# Patient Record
Sex: Female | Born: 1990 | Race: Black or African American | Hispanic: No | Marital: Single | Smoking: Never smoker
Health system: Southern US, Community
[De-identification: ages and names within clinical notes are randomized; demographics above are authoritative.]

## PROBLEM LIST (undated history)

## (undated) DIAGNOSIS — Z789 Other specified health status: Secondary | ICD-10-CM

---

## 2016-09-07 LAB — OB RESULTS CONSOLE HEPATITIS B SURFACE ANTIGEN: Hepatitis B Surface Ag: NEGATIVE

## 2016-09-07 LAB — OB RESULTS CONSOLE ABO/RH: RH TYPE: NEGATIVE

## 2016-09-07 LAB — OB RESULTS CONSOLE RUBELLA ANTIBODY, IGM: Rubella: IMMUNE

## 2016-09-07 LAB — OB RESULTS CONSOLE GC/CHLAMYDIA
Chlamydia: NEGATIVE
Gonorrhea: NEGATIVE

## 2016-09-07 LAB — OB RESULTS CONSOLE HIV ANTIBODY (ROUTINE TESTING): HIV: NONREACTIVE

## 2016-09-07 LAB — OB RESULTS CONSOLE RPR: RPR: NONREACTIVE

## 2017-02-11 ENCOUNTER — Inpatient Hospital Stay (HOSPITAL_COMMUNITY)
Admission: AD | Admit: 2017-02-11 | Discharge: 2017-02-14 | DRG: 782 | Discharge status: Against Medical Advice | Payer: Medicaid Other | Source: Ambulatory Visit | Attending: Obstetrics and Gynecology | Admitting: Obstetrics and Gynecology

## 2017-02-11 ENCOUNTER — Encounter (HOSPITAL_COMMUNITY): Payer: Self-pay

## 2017-02-11 ENCOUNTER — Inpatient Hospital Stay (HOSPITAL_COMMUNITY): Payer: Medicaid Other

## 2017-02-11 DIAGNOSIS — O403XX0 Polyhydramnios, third trimester, not applicable or unspecified: Secondary | ICD-10-CM | POA: Diagnosis present

## 2017-02-11 DIAGNOSIS — Z5321 Procedure and treatment not carried out due to patient leaving prior to being seen by health care provider: Secondary | ICD-10-CM | POA: Diagnosis present

## 2017-02-11 DIAGNOSIS — O4693 Antepartum hemorrhage, unspecified, third trimester: Secondary | ICD-10-CM

## 2017-02-11 DIAGNOSIS — O34211 Maternal care for low transverse scar from previous cesarean delivery: Secondary | ICD-10-CM | POA: Diagnosis present

## 2017-02-11 DIAGNOSIS — O4593 Premature separation of placenta, unspecified, third trimester: Secondary | ICD-10-CM | POA: Diagnosis not present

## 2017-02-11 DIAGNOSIS — Z880 Allergy status to penicillin: Secondary | ICD-10-CM

## 2017-02-11 DIAGNOSIS — Z3A35 35 weeks gestation of pregnancy: Secondary | ICD-10-CM | POA: Diagnosis not present

## 2017-02-11 DIAGNOSIS — O4690 Antepartum hemorrhage, unspecified, unspecified trimester: Secondary | ICD-10-CM

## 2017-02-11 DIAGNOSIS — N939 Abnormal uterine and vaginal bleeding, unspecified: Secondary | ICD-10-CM | POA: Diagnosis present

## 2017-02-11 HISTORY — DX: Other specified health status: Z78.9

## 2017-02-11 LAB — RAPID URINE DRUG SCREEN, HOSP PERFORMED
AMPHETAMINES: NOT DETECTED
Barbiturates: NOT DETECTED
Benzodiazepines: NOT DETECTED
COCAINE: NOT DETECTED
Opiates: NOT DETECTED
TETRAHYDROCANNABINOL: POSITIVE — AB

## 2017-02-11 LAB — CBC
HCT: 26.1 % — ABNORMAL LOW (ref 36.0–46.0)
HCT: 26.5 % — ABNORMAL LOW (ref 36.0–46.0)
HEMATOCRIT: 27.9 % — AB (ref 36.0–46.0)
HEMOGLOBIN: 8.8 g/dL — AB (ref 12.0–15.0)
HEMOGLOBIN: 8.8 g/dL — AB (ref 12.0–15.0)
Hemoglobin: 9.3 g/dL — ABNORMAL LOW (ref 12.0–15.0)
MCH: 27.1 pg (ref 26.0–34.0)
MCH: 26.7 pg (ref 26.0–34.0)
MCH: 27 pg (ref 26.0–34.0)
MCHC: 33.7 g/dL (ref 30.0–36.0)
MCHC: 33.2 g/dL (ref 30.0–36.0)
MCHC: 33.3 g/dL (ref 30.0–36.0)
MCV: 80.3 fL (ref 78.0–100.0)
MCV: 80.5 fL (ref 78.0–100.0)
MCV: 80.9 fL (ref 78.0–100.0)
PLATELETS: 248 10*3/uL (ref 150–400)
PLATELETS: 262 10*3/uL (ref 150–400)
Platelets: 240 10*3/uL (ref 150–400)
RBC: 3.25 MIL/uL — ABNORMAL LOW (ref 3.87–5.11)
RBC: 3.29 MIL/uL — AB (ref 3.87–5.11)
RBC: 3.45 MIL/uL — AB (ref 3.87–5.11)
RDW: 14.8 % (ref 11.5–15.5)
RDW: 14.8 % (ref 11.5–15.5)
RDW: 14.9 % (ref 11.5–15.5)
WBC: 7.9 10*3/uL (ref 4.0–10.5)
WBC: 10 10*3/uL (ref 4.0–10.5)
WBC: 12.3 10*3/uL — AB (ref 4.0–10.5)

## 2017-02-11 LAB — RPR: RPR: NONREACTIVE

## 2017-02-11 LAB — HIV ANTIBODY (ROUTINE TESTING W REFLEX): HIV SCREEN 4TH GENERATION: NONREACTIVE

## 2017-02-11 LAB — OB RESULTS CONSOLE GBS: STREP GROUP B AG: NEGATIVE

## 2017-02-11 LAB — GROUP B STREP BY PCR: GROUP B STREP BY PCR: NEGATIVE

## 2017-02-11 MED ORDER — ACETAMINOPHEN 325 MG PO TABS: 650.0000 mg | ORAL_TABLET | ORAL | Status: DC | PRN

## 2017-02-11 MED ORDER — EPHEDRINE 5 MG/ML INJ: 10.0000 mg | INTRAVENOUS | Status: DC | PRN

## 2017-02-11 MED ORDER — PHENYLEPHRINE 40 MCG/ML (10ML) SYRINGE FOR IV PUSH (FOR BLOOD PRESSURE SUPPORT): 80.0000 ug | PREFILLED_SYRINGE | INTRAVENOUS | Status: DC | PRN

## 2017-02-11 MED ORDER — PRENATAL MULTIVITAMIN CH
1.0000 | ORAL_TABLET | Freq: Every day | ORAL | Status: DC
  Filled 2017-02-11: qty 1

## 2017-02-11 MED ORDER — FLEET ENEMA 7-19 GM/118ML RE ENEM: 1.0000 | ENEMA | RECTAL | Status: DC | PRN

## 2017-02-11 MED ORDER — LACTATED RINGERS IV SOLN
INTRAVENOUS | Status: DC
  Administered 2017-02-11: 13:00:00 via INTRAVENOUS

## 2017-02-11 MED ORDER — FENTANYL 2.5 MCG/ML BUPIVACAINE 1/10 % EPIDURAL INFUSION (WH - ANES): 14.0000 mL/h | INTRAMUSCULAR | Status: DC | PRN

## 2017-02-11 MED ORDER — OXYTOCIN BOLUS FROM INFUSION: 500.0000 mL | Freq: Once | INTRAVENOUS | Status: DC

## 2017-02-11 MED ORDER — LIDOCAINE HCL (PF) 1 % IJ SOLN: 30.0000 mL | INTRAMUSCULAR | Status: DC | PRN

## 2017-02-11 MED ORDER — OXYTOCIN 40 UNITS IN LACTATED RINGERS INFUSION - SIMPLE MED: 2.5000 [IU]/h | INTRAVENOUS | Status: DC

## 2017-02-11 MED ORDER — FENTANYL CITRATE (PF) 100 MCG/2ML IJ SOLN
100.0000 ug | INTRAMUSCULAR | Status: DC | PRN
  Administered 2017-02-11 (×3): 100 ug via INTRAVENOUS
  Filled 2017-02-11 (×3): qty 2

## 2017-02-11 MED ORDER — BUTORPHANOL TARTRATE 1 MG/ML IJ SOLN
1.0000 mg | INTRAMUSCULAR | Status: DC | PRN
  Administered 2017-02-11: 1 mg via INTRAVENOUS
  Filled 2017-02-11: qty 1

## 2017-02-11 MED ORDER — BETAMETHASONE SOD PHOS & ACET 6 (3-3) MG/ML IJ SUSP
12.0000 mg | INTRAMUSCULAR | Status: AC
  Administered 2017-02-11 – 2017-02-12 (×2): 12 mg via INTRAMUSCULAR
  Filled 2017-02-11 (×3): qty 2

## 2017-02-11 MED ORDER — SOD CITRATE-CITRIC ACID 500-334 MG/5ML PO SOLN
30.0000 mL | ORAL | Status: DC | PRN
  Filled 2017-02-11: qty 15

## 2017-02-11 MED ORDER — ACETAMINOPHEN 325 MG PO TABS
650.0000 mg | ORAL_TABLET | ORAL | Status: DC | PRN
  Filled 2017-02-11: qty 2

## 2017-02-11 MED ORDER — ZOLPIDEM TARTRATE 5 MG PO TABS
5.0000 mg | ORAL_TABLET | Freq: Every evening | ORAL | Status: DC | PRN
  Administered 2017-02-11: 5 mg via ORAL
  Filled 2017-02-11: qty 1

## 2017-02-11 MED ORDER — OXYCODONE-ACETAMINOPHEN 5-325 MG PO TABS: 1.0000 | ORAL_TABLET | ORAL | Status: DC | PRN

## 2017-02-11 MED ORDER — LACTATED RINGERS IV SOLN: INTRAVENOUS | Status: DC

## 2017-02-11 MED ORDER — CALCIUM CARBONATE ANTACID 500 MG PO CHEW: 2.0000 | CHEWABLE_TABLET | ORAL | Status: DC | PRN

## 2017-02-11 MED ORDER — LACTATED RINGERS IV SOLN: 500.0000 mL | INTRAVENOUS | Status: DC | PRN

## 2017-02-11 MED ORDER — DOCUSATE SODIUM 100 MG PO CAPS
100.0000 mg | ORAL_CAPSULE | Freq: Every day | ORAL | Status: DC
  Filled 2017-02-11: qty 1

## 2017-02-11 MED ORDER — OXYCODONE-ACETAMINOPHEN 5-325 MG PO TABS
2.0000 | ORAL_TABLET | ORAL | Status: DC | PRN
Start: 2017-02-11 — End: 2017-02-12

## 2017-02-11 MED ORDER — LACTATED RINGERS IV SOLN
INTRAVENOUS | Status: DC
  Administered 2017-02-11: 06:00:00 via INTRAVENOUS

## 2017-02-11 MED ORDER — DIPHENHYDRAMINE HCL 50 MG/ML IJ SOLN: 12.5000 mg | INTRAMUSCULAR | Status: DC | PRN

## 2017-02-11 MED ORDER — LACTATED RINGERS IV SOLN: 500.0000 mL | Freq: Once | INTRAVENOUS | Status: DC

## 2017-02-11 MED ORDER — ONDANSETRON HCL 4 MG/2ML IJ SOLN
4.0000 mg | Freq: Four times a day (QID) | INTRAMUSCULAR | Status: DC | PRN
  Administered 2017-02-11: 4 mg via INTRAVENOUS
  Filled 2017-02-11: qty 2

## 2017-02-11 NOTE — Progress Notes (Signed)
Patient ID: Brandy Wang, female   DOB: [birth year], 26 y.o.   MRN: [id number] Pt admitted with vaginal bleeding. Has continued to bleed since admission, changing pad q1hr. Continues to contract q 2-4 minutes and is uncomfortable RNST SVE 1/thick/-3 H/O C/S at 28 weeks [facility] Will transfer to L & D for continued monitoring and eval for labor.

## 2017-02-11 NOTE — Anesthesia Pain Management Evaluation Note (Signed)
  CRNA Pain Management Visit Note  Patient: Brandy Wang, 26 y.o., female  "Hello I am a member of the anesthesia team at [facility]. We have an anesthesia team available at all times to provide care throughout the hospital, including epidural management and anesthesia for C-section. I don't know your plan for the delivery whether it a natural birth, water birth, IV sedation, nitrous supplementation, doula or epidural, but we want to meet your pain goals."   1.Was your pain managed to your expectations on prior hospitalizations?   Yes   2.What is your expectation for pain management during this hospitalization?     Epidural  3.How can we help you reach that goal? Support prn  Record the patient's initial score and the patient's pain goal.   Pain: 5  Pain Goal: 3 [facility] wants you to be able to say your pain was always managed very well.  [facility],Brandy Wang 02/11/2017

## 2017-02-11 NOTE — MAU Note (Signed)
Pt here with c/o vaginal bleeding and cramping. Reports decreased fetal movement since the bleeding. "High risk because I delivered my others babies early, 36 wks and 28 weeks."

## 2017-02-11 NOTE — Progress Notes (Signed)
Patient ID: Brandy SettersJaneika Wang, female   DOB: 09/26/1990, 26 y.o.   MRN: [id number] Pt still feeling ut ctx. Pain medication does help some. VB has almost stopped FHT's 140-150's + acel's Cat 1 Toco ut ctx q 2 - 4 minutes SVE unchanged, some old blood noted on glove H & H stable  A/P IUP 35 1/7 weeks        Probable placental abruption        Prior C section  Will continue to observe in L & D. No evidence of labor at present. H & H stable. Will proceed to c section based on maternal and fetal indications

## 2017-02-11 NOTE — Progress Notes (Signed)
Patient ID: Brandy SettersJaneika Wang, female   DOB: April 19, 1991, 26 y.o.   MRN: [id number]  S: Patient seen & examined for progress of labor. Patient rating her pain 8/10, but is sleeping when walking in the room or was talking on the phone earlier. States the fentanyl is only working for 15-20 min and then wears off.    O:  Vitals:   02/11/17 1155 02/11/17 1303 02/11/17 1432 02/11/17 1515  BP: 100/82 115/66    Pulse: 72 73    Resp:  18 18 18   Temp:  98.2 F (36.8 C)    TempSrc:  Axillary    SpO2:      Weight:      Height:        Dilation: 1 Exam by:: Dr Alysia PennaErvin   FHT: 125bpm, mod var, +accels, no decels TOCO: q842min  No further large amounts of active bleeding  A/P: Switching from fentanyl to stadol Continue expectant management

## 2017-02-11 NOTE — MAU Note (Signed)
Pt reports that she woke up with cramping. Pt had increased pain when going up to the bathroom. Pt felt a warm gush and then noticed it was bright red blood. + FM.

## 2017-02-11 NOTE — MAU Provider Note (Signed)
History     CSN: [id number]  Arrival date and time: 02/11/17 0441   First Provider Initiated Contact with Patient 02/11/17 0534      Chief Complaint  Patient presents with  . Abdominal Pain  . Vaginal Bleeding   [id number] @35 .1 wks here with VB and pain. She woke up around  0300 with LAP and went to BR and noticed bright red blood in toilet. Some bleeding since. Pain is intermittent in lower abdomen. No recent IC. Reports +FM. She is receiving care at [facility] and reports hx of previous CS at 28 wks for preeclampsia and this pregnancy complicated by polyhydramnios.    OB History    Gravida Para Term Preterm AB Living   4 2     1 2    SAB TAB Ectopic Multiple Live Births   1       2      Past Medical History:  Diagnosis Date  . Medical history non-contributory     Past Surgical History:  Procedure Laterality Date  . CESAREAN SECTION      No family history on file.  Social History  Substance Use Topics  . Smoking status: Never Smoker  . Smokeless tobacco: Never Used  . Alcohol use No    Allergies:  Allergies  Allergen Reactions  . Penicillins Hives    No prescriptions prior to admission.    Review of Systems  Gastrointestinal: Positive for abdominal pain.  Genitourinary: Positive for vaginal bleeding.   Physical Exam   Blood pressure 123/77, pulse 83, temperature 98.3 F (36.8 C), temperature source Oral, resp. rate 20, height 5' 7.5" (1.715 m), weight 73.9 kg (163 lb), SpO2 100 %.  Physical Exam  Nursing note and vitals reviewed. Constitutional: She is oriented to person, place, and time. She appears well-developed and well-nourished. No distress (appears uncomfortable).  HENT:  Head: Normocephalic and atraumatic.  Neck: Normal range of motion.  Cardiovascular: Normal rate.   Respiratory: Effort normal.  GI: Soft. She exhibits no distension. There is no tenderness.  gravid  Genitourinary:  Genitourinary Comments: External: no lesions or  erythema Vagina: rugated, large amt watery red pooling, fern neg Cervix closed/50  Musculoskeletal: Normal range of motion.  Neurological: She is alert and oriented to person, place, and time.  Skin: Skin is warm and dry.  Psychiatric: She has a normal mood and affect.   EFM: 145 bpm, mod variability, + accels, no decels Toco: 2-6  Results for orders placed or performed during the hospital encounter of 02/11/17 (from the past 24 hour(s))  Group B strep by PCR     Status: None   Collection Time: 02/11/17  5:50 AM  Result Value Ref Range   Group B strep by PCR NEGATIVE NEGATIVE  CBC     Status: Abnormal   Collection Time: 02/11/17  6:00 AM  Result Value Ref Range   WBC 7.9 4.0 - 10.5 K/uL   RBC 3.25 (L) 3.87 - 5.11 MIL/uL   Hemoglobin 8.8 (L) 12.0 - 15.0 g/dL   HCT 02.7 (L) 25.3 - 66.4 %   MCV 80.3 78.0 - 100.0 fL   MCH 27.1 26.0 - 34.0 pg   MCHC 33.7 30.0 - 36.0 g/dL   RDW 40.3 47.4 - 25.9 %   Platelets 248 150 - 400 K/uL  Type and screen     Status: None   Collection Time: 02/11/17  6:00 AM  Result Value Ref Range   ABO/RH(D) O NEG  Antibody Screen POS    Sample Expiration 02/14/2017   Urine rapid drug screen (hosp performed)     Status: Abnormal   Collection Time: 02/11/17  6:35 AM  Result Value Ref Range   Opiates NONE DETECTED NONE DETECTED   Cocaine NONE DETECTED NONE DETECTED   Benzodiazepines NONE DETECTED NONE DETECTED   Amphetamines NONE DETECTED NONE DETECTED   Tetrahydrocannabinol POSITIVE (A) NONE DETECTED   Barbiturates NONE DETECTED NONE DETECTED   US: vtx, AFI 16cm, no previa or abruption MAU Course  Procedures  MDM Labs and US ordered. Presentation, clinical findings, and plan discussed with Dr. Shawnie PonsPratt. Plan for admit. Review of records shows hx of polyhydramnios, previous CS (LTCS w/2 layer closure), hx pre-e in previous pregnancies, ?IUGR.   Assessment and Plan  35 weeks Suspected abruption Reactive NST  Admit to Ante Management per  MD  Brandy Wang, CNM 02/11/2017, 6:01 AM

## 2017-02-12 DIAGNOSIS — N939 Abnormal uterine and vaginal bleeding, unspecified: Secondary | ICD-10-CM | POA: Diagnosis present

## 2017-02-12 LAB — CBC
HEMATOCRIT: 23.7 % — AB (ref 36.0–46.0)
HEMOGLOBIN: 8 g/dL — AB (ref 12.0–15.0)
MCH: 27 pg (ref 26.0–34.0)
MCHC: 33.8 g/dL (ref 30.0–36.0)
MCV: 80.1 fL (ref 78.0–100.0)
Platelets: 234 10*3/uL (ref 150–400)
RBC: 2.96 MIL/uL — ABNORMAL LOW (ref 3.87–5.11)
RDW: 15 % (ref 11.5–15.5)
WBC: 13.6 10*3/uL — ABNORMAL HIGH (ref 4.0–10.5)

## 2017-02-12 MED ORDER — PRENATAL MULTIVITAMIN CH
1.0000 | ORAL_TABLET | Freq: Every day | ORAL | Status: DC
  Filled 2017-02-12: qty 1

## 2017-02-12 MED ORDER — CALCIUM CARBONATE ANTACID 500 MG PO CHEW: 2.0000 | CHEWABLE_TABLET | ORAL | Status: DC | PRN

## 2017-02-12 MED ORDER — DOCUSATE SODIUM 100 MG PO CAPS
100.0000 mg | ORAL_CAPSULE | Freq: Every day | ORAL | Status: DC
  Administered 2017-02-12 – 2017-02-13 (×2): 100 mg via ORAL
  Filled 2017-02-12 (×3): qty 1

## 2017-02-12 MED ORDER — COMPLETENATE 29-1 MG PO CHEW
1.0000 | CHEWABLE_TABLET | Freq: Every day | ORAL | Status: DC
  Administered 2017-02-13: 1 via ORAL
  Filled 2017-02-12 (×3): qty 1

## 2017-02-12 MED ORDER — ACETAMINOPHEN 325 MG PO TABS: 650.0000 mg | ORAL_TABLET | ORAL | Status: DC | PRN

## 2017-02-12 MED ORDER — ZOLPIDEM TARTRATE 5 MG PO TABS
5.0000 mg | ORAL_TABLET | Freq: Every evening | ORAL | Status: DC | PRN
  Administered 2017-02-12 – 2017-02-13 (×2): 5 mg via ORAL
  Filled 2017-02-12 (×2): qty 1

## 2017-02-12 MED ORDER — LACTATED RINGERS IV SOLN: INTRAVENOUS | Status: DC

## 2017-02-12 NOTE — Progress Notes (Signed)
Patient ID: Brandy Wang, female   DOB: [birth year], 26 y.o.   MRN: [id number] Pt rested well overnight. Reports only a few ut ctx now. Little bleeding as well. Pt fetal movement. Wants something to eat.  PE AF VSS RNST  Toco ut ctx q10-15 mins Lungs clear Heart RRR Abd soft + Bs gravid non tender SVE no change min blood noted on glove  A/P     IUP 35 2/7 weeks     Vaginal Bleeding     Prior c section desires TOLAC.  No evidence of active labor. S/P BMZ x 2. Will transfer to antepartum for continued monitoring.

## 2017-02-12 NOTE — Progress Notes (Signed)
Labor Progress Note  Brandy SettersJaneika Wang is a 26 y.o. [phone] at 2480w2d  admitted for vaginal bleeding and suspected abruption.  S: Patient not very comfortable on my exam, complaining of leg numbness and difficulty getting comfortable, wants to walk around.  O:  BP 127/83   Pulse 79   Temp 98.4 F (36.9 C) (Axillary)   Resp 18   Ht 5' 7.5" (1.715 m)   Wt 73.9 kg (163 lb)   LMP  (Exact Date)   SpO2 100%   BMI 25.15 kg/m   No intake/output data recorded.  FHT:  130 bmp, mod variability, accels, no decels UC:   regular, every 4 minutes SVE:   Dilation: 1 Effacement (%): Thick Station: -3 Exam by:: Dr Omer JackMumaw  Labs: Lab Results  Component Value Date   WBC 12.3 (H) 02/11/2017   HGB 9.3 (L) 02/11/2017   HCT 27.9 (L) 02/11/2017   MCV 80.9 02/11/2017   PLT 262 02/11/2017    Assessment / Plan: 26 y.o. [id number] 2680w2d  Presenting for abruption, admitted for observation. Patient wanting to walk around, asked that she keep activity to minimum, walk around room and then try to rest in bed.  No further large amounts of active bleeding.  Fetal Wellbeing:  Category I Pain Control:  Currently well controlled  Continue expectant management  Howard PouchLauren Marithza Malachi, MD PGY-1 [facility]

## 2017-02-12 NOTE — Progress Notes (Signed)
Labor Progress Note  Brandy Wang is a 26 y.o. [id number] at 6468w2d  admitted for vaginal bleeding, suspected abruption.  FHT:  120 bmp, mod variability, accels, no decels UC:   occasional SVE:   Dilation: 1 Effacement (%): Thick Station: -3 Exam by:: Dr Brandy Wang   Assessment / Plan: 26 y.o. [id number] 6468w2d presenting for abruption.  No further large amounts of active bleeding.  Fetal Wellbeing:  Category I Pain Control:  Currently well controlled  Continue expectant management Expectant management  Brandy PouchLauren [name], MD PGY-1 [facility]

## 2017-02-13 MED ORDER — POLYETHYLENE GLYCOL 3350 17 G PO PACK
17.0000 g | PACK | Freq: Every day | ORAL | Status: DC
  Administered 2017-02-13: 17 g via ORAL
  Filled 2017-02-13: qty 1

## 2017-02-13 NOTE — Progress Notes (Signed)
FACULTY PRACTICE ANTEPARTUM(COMPREHENSIVE) NOTE  Brandy Wang is a 26 y.o. [id number] at 7936w3d who is admitted for vagina bleeding.   Fetal presentation is cephalic. Length of Stay:  2  Days  Subjective: Patient reports the fetal movement as active. Patient reports uterine contraction  activity as none. Patient reports  vaginal bleeding as none. Patient describes fluid per vagina as None.  Vitals:  Blood pressure 116/62, pulse 87, temperature 98 F (36.7 C), temperature source Oral, resp. rate 18, height 5' 7.5" (1.715 m), weight 73.9 kg (163 lb), SpO2 99 %. Physical Examination:  General appearance - alert, well appearing, and in no distress Heart - normal rate and regular rhythm Abdomen - soft, nontender, nondistended Fundal Height:  size equals dates Cervical Exam: Not evaluated.  Extremities: extremities normal, atraumatic, no cyanosis or edema and Homans sign is negative, no sign of DVT  Membranes:intact  Fetal Monitoring:     Fetal Heart Rate A  Mode External filed at 02/12/2017 2228  Baseline Rate (A) 140 bpm filed at 02/12/2017 2228  Variability 6-25 BPM filed at 02/12/2017 2228  Accelerations 15 x 15 filed at 02/12/2017 2228  Decelerations None filed at 02/12/2017 2228     Labs:  Results for orders placed or performed during the hospital encounter of 02/11/17 (from the past 24 hour(s))  CBC   Collection Time: 02/12/17  8:17 AM  Result Value Ref Range   WBC 13.6 (H) 4.0 - 10.5 K/uL   RBC 2.96 (L) 3.87 - 5.11 MIL/uL   Hemoglobin 8.0 (L) 12.0 - 15.0 g/dL   HCT 45.423.7 (L) 09.836.0 - 11.946.0 %   MCV 80.1 78.0 - 100.0 fL   MCH 27.0 26.0 - 34.0 pg   MCHC 33.8 30.0 - 36.0 g/dL   RDW 14.715.0 82.911.5 - 56.215.5 %   Platelets 234 150 - 400 K/uL      Medications:  Scheduled . docusate sodium  100 mg Oral Daily  . prenatal vitamin w/FE, FA  1 tablet Oral Q1200   I have reviewed the patient's current medications.  ASSESSMENT: Patient Active Problem List   Diagnosis Date Noted  .  Vaginal bleeding 02/12/2017  . Vaginal bleeding in pregnancy 02/11/2017  . Vaginal bleeding in pregnancy, third trimester 02/11/2017    PLAN: Observe for recurrent bleeding, PTL, ROM  Brandy Wang 02/13/2017,7:52 AM

## 2017-02-14 DIAGNOSIS — O4693 Antepartum hemorrhage, unspecified, third trimester: Secondary | ICD-10-CM

## 2017-02-14 DIAGNOSIS — Z3A35 35 weeks gestation of pregnancy: Secondary | ICD-10-CM | POA: Diagnosis not present

## 2017-02-14 LAB — TYPE AND SCREEN
ABO/RH(D): O NEG
Antibody Screen: POSITIVE
DAT, IgG: NEGATIVE
UNIT DIVISION: 0
Unit division: 0

## 2017-02-14 LAB — GC/CHLAMYDIA PROBE AMP (~~LOC~~) NOT AT ARMC
Chlamydia: NEGATIVE
NEISSERIA GONORRHEA: NEGATIVE

## 2017-02-14 LAB — BPAM RBC
BLOOD PRODUCT EXPIRATION DATE: 201806282359
BLOOD PRODUCT EXPIRATION DATE: 201806282359
Unit Type and Rh: 9500
Unit Type and Rh: 9500

## 2017-02-14 NOTE — Progress Notes (Addendum)
MD made aware that patient is requesting to leave. Patient then stating that she will not wait for him to see her, instructing the RN to remove her SL. Per MD patient leaving AMA. RN reviewing with patient PTL s/s and pre-E s/s as well as fetal kick counts. ROM or additional VB would need to be evaluated.  Patient stating that she understands. AMA signed by patient and no additional questions by patient or significant other.

## 2017-02-14 NOTE — Progress Notes (Signed)
OB Note Came by to round on pt and not in room. Will come by later.  Cornelia Copaharlie Toree Edling, Jr MD Attending Center for [facility] (Faculty Practice) 02/14/2017 Time: 1000

## 2017-02-14 NOTE — H&P (Signed)
Chief Complaint  Patient presents with  . Abdominal Pain  . Vaginal Bleeding   [id number] @35 .1 wks here with VB and pain. She woke up around  0300 with LAP and went to BR and noticed bright red blood in toilet. Some bleeding since. Pain is intermittent in lower abdomen. No recent IC. Reports +FM. She is receiving care at [facility] and reports hx of previous CS at 28 wks for preeclampsia and this pregnancy complicated by polyhydramnios.           OB History    Gravida Para Term Preterm AB Living   4 2     1 2    SAB TAB Ectopic Multiple Live Births   1       2          Past Medical History:  Diagnosis Date  . Medical history non-contributory          Past Surgical History:  Procedure Laterality Date  . CESAREAN SECTION      No family history on file.      Social History  Substance Use Topics  . Smoking status: Never Smoker  . Smokeless tobacco: Never Used  . Alcohol use No    Allergies:      Allergies  Allergen Reactions  . Penicillins Hives    No prescriptions prior to admission.    Review of Systems  Gastrointestinal: Positive for abdominal pain.  Genitourinary: Positive for vaginal bleeding.   Physical Exam   Blood pressure 123/77, pulse 83, temperature 98.3 F (36.8 C), temperature source Oral, resp. rate 20, height 5' 7.5" (1.715 m), weight 73.9 kg (163 lb), SpO2 100 %.  Physical Exam  Nursing note and vitals reviewed. Constitutional: She is oriented to person, place, and time. She appears well-developed and well-nourished. No distress (appears uncomfortable).  HENT:  Head: Normocephalic and atraumatic.  Neck: Normal range of motion.  Cardiovascular: Normal rate.   Respiratory: Effort normal.  GI: Soft. She exhibits no distension. There is no tenderness.  gravid  Genitourinary:  Genitourinary Comments: External: no lesions or erythema Vagina: rugated, large amt watery red pooling, fern neg Cervix closed/50   Musculoskeletal: Normal range of motion.  Neurological: She is alert and oriented to person, place, and time.  Skin: Skin is warm and dry.  Psychiatric: She has a normal mood and affect.   EFM: 145 bpm, mod variability, + accels, no decels Toco: 2-6  LabResultsLast24Hours  Results for orders placed or performed during the hospital encounter of 02/11/17 (from the past 24 hour(s))  Group B strep by PCR     Status: None   Collection Time: 02/11/17  5:50 AM  Result Value Ref Range   Group B strep by PCR NEGATIVE NEGATIVE  CBC     Status: Abnormal   Collection Time: 02/11/17  6:00 AM  Result Value Ref Range   WBC 7.9 4.0 - 10.5 K/uL   RBC 3.25 (L) 3.87 - 5.11 MIL/uL   Hemoglobin 8.8 (L) 12.0 - 15.0 g/dL   HCT 45.4 (L) 09.8 - 11.9 %   MCV 80.3 78.0 - 100.0 fL   MCH 27.1 26.0 - 34.0 pg   MCHC 33.7 30.0 - 36.0 g/dL   RDW 14.7 82.9 - 56.2 %   Platelets 248 150 - 400 K/uL  Type and screen     Status: None   Collection Time: 02/11/17  6:00 AM  Result Value Ref Range   ABO/RH(D) O NEG    Antibody Screen  POS    Sample Expiration 02/14/2017   Urine rapid drug screen (hosp performed)     Status: Abnormal   Collection Time: 02/11/17  6:35 AM  Result Value Ref Range   Opiates NONE DETECTED NONE DETECTED   Cocaine NONE DETECTED NONE DETECTED   Benzodiazepines NONE DETECTED NONE DETECTED   Amphetamines NONE DETECTED NONE DETECTED   Tetrahydrocannabinol POSITIVE (A) NONE DETECTED   Barbiturates NONE DETECTED NONE DETECTED     US: vtx, AFI 16cm, no previa or abruption MAU Course  Procedures  MDM Labs and US ordered. Presentation, clinical findings, and plan discussed with Dr. Shawnie PonsPratt. Plan for admit. Review of records shows hx of polyhydramnios, previous CS (LTCS w/2 layer closure), hx pre-e in previous pregnancies, ?IUGR.   Assessment and Plan  35 weeks Suspected abruption Reactive NST  Admit to Ante Management per MD  Donette LarryMelanie Bhambri,  CNM 02/11/2017, 6:01 AM

## 2017-02-17 LAB — TYPE AND SCREEN
ABO/RH(D): O NEG
ANTIBODY SCREEN: POSITIVE
DAT, IGG: NEGATIVE
Unit division: 0
Unit division: 0

## 2017-02-17 LAB — BPAM RBC
BLOOD PRODUCT EXPIRATION DATE: 201806282359
BLOOD PRODUCT EXPIRATION DATE: 201806282359
UNIT TYPE AND RH: 9500
UNIT TYPE AND RH: 9500

## 2017-02-18 NOTE — Discharge Summary (Addendum)
Obstetrics Discharge Summary Date of Admission: 02/11/2017 Date of Discharge: 02/14/2017  Patient left AMA, per RN staff  Allergies as of 02/14/2017      Reactions   Other Anaphylaxis   seafood   Penicillins Hives   Has patient had a PCN reaction causing immediate rash, facial/tongue/throat swelling, SOB or lightheadedness with hypotension: unknown Has patient had a PCN reaction causing severe rash involving mucus membranes or skin necrosis:  Has patient had a PCN reaction that required hospitalization:  Has patient had a PCN reaction occurring within the last 10 years: No If all of the above answers are "NO", then may proceed with Cephalosporin use.      Medication List    ASK your doctor about these medications   PRENATAL GUMMIES/DHA & FA PO Take 2 each by mouth daily.       No future appointments.  Cornelia Copaharlie Neilan Rizzo, Jr. MD Attending Center for [facility](Faculty Practice)

## 2017-02-19 ENCOUNTER — Inpatient Hospital Stay (HOSPITAL_COMMUNITY)
Admission: AD | Admit: 2017-02-19 | Discharge: 2017-02-26 | DRG: 766 | Discharge status: Against Medical Advice | Payer: Medicaid Other | Source: Ambulatory Visit | Attending: Obstetrics & Gynecology | Admitting: Obstetrics & Gynecology

## 2017-02-19 ENCOUNTER — Inpatient Hospital Stay (HOSPITAL_COMMUNITY): Payer: Medicaid Other

## 2017-02-19 ENCOUNTER — Encounter (HOSPITAL_COMMUNITY): Payer: Self-pay

## 2017-02-19 DIAGNOSIS — O1403 Mild to moderate pre-eclampsia, third trimester: Secondary | ICD-10-CM | POA: Diagnosis not present

## 2017-02-19 DIAGNOSIS — O1404 Mild to moderate pre-eclampsia, complicating childbirth: Secondary | ICD-10-CM | POA: Diagnosis present

## 2017-02-19 DIAGNOSIS — E876 Hypokalemia: Secondary | ICD-10-CM | POA: Clinically undetermined

## 2017-02-19 DIAGNOSIS — O26893 Other specified pregnancy related conditions, third trimester: Secondary | ICD-10-CM | POA: Diagnosis present

## 2017-02-19 DIAGNOSIS — Z3A37 37 weeks gestation of pregnancy: Secondary | ICD-10-CM | POA: Diagnosis not present

## 2017-02-19 DIAGNOSIS — O9902 Anemia complicating childbirth: Secondary | ICD-10-CM | POA: Diagnosis present

## 2017-02-19 DIAGNOSIS — O34211 Maternal care for low transverse scar from previous cesarean delivery: Secondary | ICD-10-CM | POA: Diagnosis present

## 2017-02-19 DIAGNOSIS — O1415 Severe pre-eclampsia, complicating the puerperium: Secondary | ICD-10-CM | POA: Diagnosis not present

## 2017-02-19 DIAGNOSIS — O34219 Maternal care for unspecified type scar from previous cesarean delivery: Secondary | ICD-10-CM | POA: Diagnosis present

## 2017-02-19 DIAGNOSIS — D649 Anemia, unspecified: Secondary | ICD-10-CM | POA: Diagnosis present

## 2017-02-19 DIAGNOSIS — O4693 Antepartum hemorrhage, unspecified, third trimester: Secondary | ICD-10-CM | POA: Diagnosis present

## 2017-02-19 DIAGNOSIS — Z6791 Unspecified blood type, Rh negative: Secondary | ICD-10-CM

## 2017-02-19 DIAGNOSIS — Z91199 Patient's noncompliance with other medical treatment and regimen due to unspecified reason: Secondary | ICD-10-CM

## 2017-02-19 DIAGNOSIS — O4590 Premature separation of placenta, unspecified, unspecified trimester: Secondary | ICD-10-CM | POA: Diagnosis present

## 2017-02-19 DIAGNOSIS — Z8759 Personal history of other complications of pregnancy, childbirth and the puerperium: Secondary | ICD-10-CM

## 2017-02-19 DIAGNOSIS — Z3A36 36 weeks gestation of pregnancy: Secondary | ICD-10-CM

## 2017-02-19 DIAGNOSIS — O09213 Supervision of pregnancy with history of pre-term labor, third trimester: Secondary | ICD-10-CM

## 2017-02-19 DIAGNOSIS — Z9119 Patient's noncompliance with other medical treatment and regimen: Secondary | ICD-10-CM

## 2017-02-19 DIAGNOSIS — O26899 Other specified pregnancy related conditions, unspecified trimester: Secondary | ICD-10-CM

## 2017-02-19 DIAGNOSIS — O09893 Supervision of other high risk pregnancies, third trimester: Secondary | ICD-10-CM

## 2017-02-19 DIAGNOSIS — O4593 Premature separation of placenta, unspecified, third trimester: Principal | ICD-10-CM | POA: Diagnosis present

## 2017-02-19 LAB — URINALYSIS, ROUTINE W REFLEX MICROSCOPIC
BACTERIA UA: NONE SEEN
Bacteria, UA: NONE SEEN
Bilirubin Urine: NEGATIVE
Glucose, UA: NEGATIVE mg/dL
Glucose, UA: NEGATIVE mg/dL
Hgb urine dipstick: NEGATIVE
Ketones, ur: NEGATIVE mg/dL
Ketones, ur: NEGATIVE mg/dL
LEUKOCYTES UA: NEGATIVE
Leukocytes, UA: NEGATIVE
Nitrite: NEGATIVE
Nitrite: NEGATIVE
PH: 5 (ref 5.0–8.0)
Protein, ur: >=300 mg/dL — AB
Protein, ur: 100 mg/dL — AB
SPECIFIC GRAVITY, URINE: 1.035 — AB (ref 1.005–1.030)
Specific Gravity, Urine: 1.019 (ref 1.005–1.030)
pH: 6 (ref 5.0–8.0)

## 2017-02-19 LAB — CBC WITH DIFFERENTIAL/PLATELET
Basophils Absolute: 0 10*3/uL (ref 0.0–0.1)
Basophils Relative: 0 %
Eosinophils Absolute: 0.4 10*3/uL (ref 0.0–0.7)
Eosinophils Relative: 4 %
HCT: 24.7 % — ABNORMAL LOW (ref 36.0–46.0)
Hemoglobin: 8.2 g/dL — ABNORMAL LOW (ref 12.0–15.0)
LYMPHS ABS: 2.3 10*3/uL (ref 0.7–4.0)
LYMPHS PCT: 24 %
MCH: 26.6 pg (ref 26.0–34.0)
MCHC: 33.2 g/dL (ref 30.0–36.0)
MCV: 80.2 fL (ref 78.0–100.0)
MONO ABS: 0.3 10*3/uL (ref 0.1–1.0)
MONOS PCT: 3 %
Neutro Abs: 6.7 10*3/uL (ref 1.7–7.7)
Neutrophils Relative %: 69 %
Platelets: 253 10*3/uL (ref 150–400)
RBC: 3.08 MIL/uL — ABNORMAL LOW (ref 3.87–5.11)
RDW: 15.1 % (ref 11.5–15.5)
WBC: 9.7 10*3/uL (ref 4.0–10.5)

## 2017-02-19 LAB — RAPID HIV SCREEN (HIV 1/2 AB+AG)
HIV 1/2 Antibodies: NONREACTIVE
HIV-1 P24 Antigen - HIV24: NONREACTIVE

## 2017-02-19 LAB — COMPREHENSIVE METABOLIC PANEL
ALT: 9 U/L — AB (ref 14–54)
ANION GAP: 6 (ref 5–15)
AST: 17 U/L (ref 15–41)
Albumin: 2.6 g/dL — ABNORMAL LOW (ref 3.5–5.0)
Alkaline Phosphatase: 123 U/L (ref 38–126)
BUN: 9 mg/dL (ref 6–20)
CHLORIDE: 104 mmol/L (ref 101–111)
CO2: 25 mmol/L (ref 22–32)
CREATININE: 0.58 mg/dL (ref 0.44–1.00)
Calcium: 8.8 mg/dL — ABNORMAL LOW (ref 8.9–10.3)
Glucose, Bld: 73 mg/dL (ref 65–99)
POTASSIUM: 3.3 mmol/L — AB (ref 3.5–5.1)
Sodium: 135 mmol/L (ref 135–145)
Total Bilirubin: 0.5 mg/dL (ref 0.3–1.2)
Total Protein: 5.8 g/dL — ABNORMAL LOW (ref 6.5–8.1)

## 2017-02-19 LAB — RAPID URINE DRUG SCREEN, HOSP PERFORMED
Amphetamines: NOT DETECTED
BARBITURATES: NOT DETECTED
Benzodiazepines: NOT DETECTED
Cocaine: NOT DETECTED
OPIATES: NOT DETECTED
TETRAHYDROCANNABINOL: POSITIVE — AB

## 2017-02-19 LAB — KLEIHAUER-BETKE STAIN
# VIALS RHIG: 1
Fetal Cells %: 0.1 %
QUANTITATION FETAL HEMOGLOBIN: 5 mL

## 2017-02-19 LAB — PREPARE RBC (CROSSMATCH)

## 2017-02-19 LAB — PROTEIN / CREATININE RATIO, URINE: Total Protein, Urine: 319 mg/dL

## 2017-02-19 MED ORDER — CALCIUM CARBONATE ANTACID 500 MG PO CHEW: 2.0000 | CHEWABLE_TABLET | ORAL | Status: DC | PRN

## 2017-02-19 MED ORDER — SODIUM CHLORIDE 0.9 % IV SOLN
510.0000 mg | Freq: Once | INTRAVENOUS | Status: AC
  Administered 2017-02-19: 510 mg via INTRAVENOUS
  Filled 2017-02-19: qty 17

## 2017-02-19 MED ORDER — SODIUM CHLORIDE 0.9 % IV SOLN
Freq: Once | INTRAVENOUS | Status: AC
  Administered 2017-02-19: 22:00:00 via INTRAVENOUS

## 2017-02-19 MED ORDER — ZOLPIDEM TARTRATE 5 MG PO TABS
5.0000 mg | ORAL_TABLET | Freq: Every evening | ORAL | Status: DC | PRN
  Administered 2017-02-20: 5 mg via ORAL
  Filled 2017-02-19: qty 1

## 2017-02-19 MED ORDER — PRENATAL MULTIVITAMIN CH
1.0000 | ORAL_TABLET | Freq: Every day | ORAL | Status: DC
  Administered 2017-02-20 – 2017-02-21 (×2): 1 via ORAL
  Filled 2017-02-19 (×3): qty 1

## 2017-02-19 MED ORDER — ACETAMINOPHEN 325 MG PO TABS
650.0000 mg | ORAL_TABLET | ORAL | Status: DC | PRN
  Filled 2017-02-19 (×3): qty 2

## 2017-02-19 MED ORDER — DOCUSATE SODIUM 100 MG PO CAPS
100.0000 mg | ORAL_CAPSULE | Freq: Every day | ORAL | Status: DC
  Administered 2017-02-20: 100 mg via ORAL
  Filled 2017-02-19 (×2): qty 1

## 2017-02-19 NOTE — MAU Provider Note (Signed)
History   [id number] @ 36.2 wks in with continued bleeding since leaving AMA on 02/11/17 for evaluation for possible abruption. Denies ROM.   CSN: [id number]  Arrival date & time 02/19/17  1559   None     Chief Complaint  Patient presents with  . Vaginal Bleeding  . Abdominal Pain    HPI  Past Medical History:  Diagnosis Date  . Medical history non-contributory     Past Surgical History:  Procedure Laterality Date  . CESAREAN SECTION      History reviewed. No pertinent family history.  Social History  Substance Use Topics  . Smoking status: Never Smoker  . Smokeless tobacco: Never Used  . Alcohol use No    OB History    Gravida Para Term Preterm AB Living   4 2     1 2    SAB TAB Ectopic Multiple Live Births   1       2      Review of Systems  Constitutional: Negative.   HENT: Negative.   Eyes: Negative.   Respiratory: Negative.   Cardiovascular: Negative.   Gastrointestinal: Positive for abdominal pain.  Endocrine: Negative.   Genitourinary: Positive for vaginal bleeding.  Musculoskeletal: Negative.   Skin: Negative.   Allergic/Immunologic: Negative.   Neurological: Negative.   Hematological: Negative.   Psychiatric/Behavioral: Negative.     Allergies  Other and Penicillins  Home Medications    BP 135/79   Pulse 88   Temp 98.9 F (37.2 C) (Oral)   Resp 18   Ht 5' 7.5" (1.715 m)   Wt 165 lb 12 oz (75.2 kg)   LMP  (Exact Date)   SpO2 99%   BMI 25.58 kg/m   Physical Exam  Constitutional: She is oriented to person, place, and time. She appears well-developed and well-nourished.  HENT:  Head: Normocephalic.  Eyes: Pupils are equal, round, and reactive to light.  Neck: Normal range of motion.  Cardiovascular: Normal rate, normal heart sounds and intact distal pulses.   Pulmonary/Chest: Effort normal and breath sounds normal.  Abdominal: Soft. Bowel sounds are normal.  Genitourinary: Vagina normal and uterus normal.  Musculoskeletal: Normal  range of motion.  Neurological: She is alert and oriented to person, place, and time. She has normal reflexes.  Skin: Skin is warm and dry.  Psychiatric: She has a normal mood and affect. Her behavior is normal. Judgment and thought content normal.    MAU Course  Procedures (including critical care time)  Labs Reviewed  URINALYSIS, ROUTINE W REFLEX MICROSCOPIC - Abnormal; Notable for the following:       Result Value   Color, Urine AMBER (*)    APPearance CLOUDY (*)    Specific Gravity, Urine 1.035 (*)    Hgb urine dipstick LARGE (*)    Bilirubin Urine SMALL (*)    Protein, ur >=300 (*)    Squamous Epithelial / LPF 6-30 (*)    All other components within normal limits  RAPID URINE DRUG SCREEN, HOSP PERFORMED   No results found.   No diagnosis found.    MDM  VSS, SVE dimple/th/post. sm amt dark brown bleeding. FHR reassurring. POC discussed with Dr. Penne LashLeggett [facility] made aware.

## 2017-02-19 NOTE — MAU Note (Addendum)
Pt states she was seen last week for bleeding and was discharged on Monday. Pt states on Thursday night 6/7 she was at home and started having some bleeding. Pt states the bleeding has been happening since that time. Pt states it will get less and more as the day goes on. Pt states the baby has been moving less than normal since last week. Pt c/o lower abdominal pain and back pain today. Pt c/o pain in her upper right side under her breast. Pt c/o nausea today. Pt denies blurred vision and headache.

## 2017-02-19 NOTE — Progress Notes (Signed)
Patient placed on EFM. FHR 145. Abdomen soft on palpation. Pt states that her bleeding has decreased and is dark brown upon arrival to the unit. Pt states pain is 6/10 on pain scale but denies the need for pain medication. Pt oriented to room. Call bell in reach Carmelina DaneERRI L Dannica Bickham, RN

## 2017-02-19 NOTE — H&P (Signed)
History   G4P112 @ 36.2 wks in with continued bleeding since leaving AMA on 02/11/17 for evaluation for possible abruption. Denies ROM.   CSN: [id number]  Arrival date & time 02/19/17  1559   None        Chief Complaint  Patient presents with  . Vaginal Bleeding  . Abdominal Pain    HPI      Past Medical History:  Diagnosis Date  . Medical history non-contributory          Past Surgical History:  Procedure Laterality Date  . CESAREAN SECTION      History reviewed. No pertinent family history.      Social History  Substance Use Topics  . Smoking status: Never Smoker  . Smokeless tobacco: Never Used  . Alcohol use No            OB History    Gravida Para Term Preterm AB Living   4 2     1 2    SAB TAB Ectopic Multiple Live Births   1       2      Review of Systems  Constitutional: Negative.   HENT: Negative.   Eyes: Negative.   Respiratory: Negative.   Cardiovascular: Negative.   Gastrointestinal: Positive for abdominal pain.  Endocrine: Negative.   Genitourinary: Positive for vaginal bleeding.  Musculoskeletal: Negative.   Skin: Negative.   Allergic/Immunologic: Negative.   Neurological: Negative.   Hematological: Negative.   Psychiatric/Behavioral: Negative.     Allergies  Other and Penicillins  Home Medications    BP 135/79   Pulse 88   Temp 98.9 F (37.2 C) (Oral)   Resp 18   Ht 5' 7.5" (1.715 m)   Wt 165 lb 12 oz (75.2 kg)   LMP  (Exact Date)   SpO2 99%   BMI 25.58 kg/m   Physical Exam  Constitutional: She is oriented to person, place, and time. She appears well-developed and well-nourished.  HENT:  Head: Normocephalic.  Eyes: Pupils are equal, round, and reactive to light.  Neck: Normal range of motion.  Cardiovascular: Normal rate, normal heart sounds and intact distal pulses.   Pulmonary/Chest: Effort normal and breath sounds normal.  Abdominal: Soft. Bowel sounds are normal.  Genitourinary:  Vagina normal and uterus normal.  Musculoskeletal: Normal range of motion.  Neurological: She is alert and oriented to person, place, and time. She has normal reflexes.  Skin: Skin is warm and dry.  Psychiatric: She has a normal mood and affect. Her behavior is normal. Judgment and thought content normal.    MAU Course  Procedures (including critical care time)       Labs Reviewed  URINALYSIS, ROUTINE W REFLEX MICROSCOPIC - Abnormal; Notable for the following:       Result Value    Color, Urine AMBER (*)    APPearance CLOUDY (*)    Specific Gravity, Urine 1.035 (*)    Hgb urine dipstick LARGE (*)    Bilirubin Urine SMALL (*)    Protein, ur >=300 (*)    Squamous Epithelial / LPF 6-30 (*)    All other components within normal limits  RAPID URINE DRUG SCREEN, HOSP PERFORMED   Imaging Results (Last 48 hours)  No results found.     No diagnosis found.    MDM  VSS, SVE dimple/th/post. sm amt dark brown bleeding. FHR reassurring. POC discussed with Dr. Penne Lash pt to be admitted to [facility]. NICU made aware.  Pt seen and examined.  1-Rpt MFM US 2-O neg--rhogam 3-continuous monitoring for next 4 hours then reassess 4-KB 5-UDS 6-Cath UA for protein/creatinine ration (elevated pressure in triage). 7-Need plan for delivery with care team.

## 2017-02-19 NOTE — Progress Notes (Signed)
Patient taken to Radiology Dept for US.

## 2017-02-20 DIAGNOSIS — Z3A36 36 weeks gestation of pregnancy: Secondary | ICD-10-CM

## 2017-02-20 DIAGNOSIS — O4593 Premature separation of placenta, unspecified, third trimester: Secondary | ICD-10-CM

## 2017-02-20 LAB — RPR: RPR Ser Ql: NONREACTIVE

## 2017-02-20 MED ORDER — RHO D IMMUNE GLOBULIN 1500 UNIT/2ML IJ SOSY
300.0000 ug | PREFILLED_SYRINGE | Freq: Once | INTRAMUSCULAR | Status: AC
  Administered 2017-02-21: 300 ug via INTRAMUSCULAR
  Filled 2017-02-20: qty 2

## 2017-02-20 NOTE — Progress Notes (Signed)
We called Lab for Rhogam injection and was informed that a study was not done. We will ask MD for an order ASAP.

## 2017-02-20 NOTE — Progress Notes (Signed)
Rhogam order clarified, patient was informed that she will receive a  Rhogam injection tonight.

## 2017-02-20 NOTE — Progress Notes (Signed)
Brandy Wang shows fetal cells, warranting 1 ampule of RhIG. Will treat with Rhogam/Rhophylac 1 unit.

## 2017-02-20 NOTE — Plan of Care (Signed)
Problem: Health Behavior/Discharge Planning: Goal: Ability to manage health-related needs will improve Outcome: Not Met (add Reason) Patient stated that will be leaving on Monday February 21, 2017 at 7 AM to an appointment she had planned prior to her hospitalization. We tried to discouraged her from leaving [place].  MD visited with her and discussed the Plan of Care including the risk leaving may have on the fetus. Pt verbalized understanding of same, but insisted on leaving if we do not induce her by 37 wk's.  Comments: Patient stated that will be leaving on Monday February 21, 2017 at 7 AM to an appointment she had planned prior to her hospitalization. We tried to discouraged her from leaving [place].  MD visited with her and discussed the Plan of Care including the risk leaving may have on the fetus. Pt verbalized understanding of same, but insisted on leaving if we do not induce her by 37 wk's.

## 2017-02-20 NOTE — Progress Notes (Signed)
FACULTY PRACTICE ANTEPARTUM(COMPREHENSIVE) NOTE  Brandy SettersJaneika Cofield is a 26 y.o. [id number] at 1882w3d by LMP, midtrimester ultrasound who is admitted for abruption, for her second bleed. U/s shows a 5 cm clot below the left lower edge of the placenta..   Fetal presentation is cephalic. Length of Stay:  1  Days  Subjective: Pt desires to know the date of delivery. Discussed with dr Penne LashLeggett, and our impression is that delivery at 37 week to be desired. Patient reports the fetal movement as active. Patient reports uterine contraction  activity as none. Patient reports  vaginal bleeding as less flow than a normal period. Patient describes fluid per vagina as None.  Vitals:  Blood pressure 133/87, pulse 77, temperature 97.9 F (36.6 C), temperature source Oral, resp. rate 16, height 5' 7.5" (1.715 m), weight 75.2 kg (165 lb 12 oz), SpO2 100 %. Physical Examination:  General appearance - alert, well appearing, and in no distress and oriented to person, place, and time Heart - normal rate and regular rhythm Abdomen - soft, nontender, nondistended Fundal Height:  size equals dates Cervical Exam: Not evaluated. and found to be / / and fetal presentation is vertex. Extremities: extremities normal, atraumatic, no cyanosis or edema and Homans sign is negative, no sign of DVT with DTRs 2+ bilaterally Membranes:intact  Fetal Monitoring:  Baseline: 150 bpm, Variability: Good {> 6 bpm), Accelerations: Reactive and Decelerations: Absent  Labs:  Results for orders placed or performed during the hospital encounter of 02/19/17 (from the past 24 hour(s))  Urinalysis, Routine w reflex microscopic   Collection Time: 02/19/17  4:15 PM  Result Value Ref Range   Color, Urine AMBER (A) YELLOW   APPearance CLOUDY (A) CLEAR   Specific Gravity, Urine 1.035 (H) 1.005 - 1.030   pH 5.0 5.0 - 8.0   Glucose, UA NEGATIVE NEGATIVE mg/dL   Hgb urine dipstick LARGE (A) NEGATIVE   Bilirubin Urine SMALL (A) NEGATIVE   Ketones,  ur NEGATIVE NEGATIVE mg/dL   Protein, ur >=086>=300 (A) NEGATIVE mg/dL   Nitrite NEGATIVE NEGATIVE   Leukocytes, UA NEGATIVE NEGATIVE   RBC / HPF TOO NUMEROUS TO COUNT 0 - 5 RBC/hpf   WBC, UA 6-30 0 - 5 WBC/hpf   Bacteria, UA NONE SEEN NONE SEEN   Squamous Epithelial / LPF 6-30 (A) NONE SEEN   Mucous PRESENT   Urine rapid drug screen (hosp performed)   Collection Time: 02/19/17  4:15 PM  Result Value Ref Range   Opiates NONE DETECTED NONE DETECTED   Cocaine NONE DETECTED NONE DETECTED   Benzodiazepines NONE DETECTED NONE DETECTED   Amphetamines NONE DETECTED NONE DETECTED   Tetrahydrocannabinol POSITIVE (A) NONE DETECTED   Barbiturates NONE DETECTED NONE DETECTED  Protein / creatinine ratio, urine   Collection Time: 02/19/17  4:15 PM  Result Value Ref Range   Creatinine, Urine >4,000 mg/dL   Total Protein, Urine 319 mg/dL   Protein Creatinine Ratio        0.00 - 0.15 mg/mg[Cre]  Kleihauer-Betke stain   Collection Time: 02/19/17  5:47 PM  Result Value Ref Range   Fetal Cells % 0.1 %   Quantitation Fetal Hemoglobin 5 mL   # Vials RhIg 1   CBC with Differential/Platelet   Collection Time: 02/19/17  5:52 PM  Result Value Ref Range   WBC 9.7 4.0 - 10.5 K/uL   RBC 3.08 (L) 3.87 - 5.11 MIL/uL   Hemoglobin 8.2 (L) 12.0 - 15.0 g/dL   HCT 57.824.7 (L) 46.936.0 -  46.0 %   MCV 80.2 78.0 - 100.0 fL   MCH 26.6 26.0 - 34.0 pg   MCHC 33.2 30.0 - 36.0 g/dL   RDW 16.1 09.6 - 04.5 %   Platelets 253 150 - 400 K/uL   Neutrophils Relative % 69 %   Neutro Abs 6.7 1.7 - 7.7 K/uL   Lymphocytes Relative 24 %   Lymphs Abs 2.3 0.7 - 4.0 K/uL   Monocytes Relative 3 %   Monocytes Absolute 0.3 0.1 - 1.0 K/uL   Eosinophils Relative 4 %   Eosinophils Absolute 0.4 0.0 - 0.7 K/uL   Basophils Relative 0 %   Basophils Absolute 0.0 0.0 - 0.1 K/uL  Comprehensive metabolic panel   Collection Time: 02/19/17  5:52 PM  Result Value Ref Range   Sodium 135 135 - 145 mmol/L   Potassium 3.3 (L) 3.5 - 5.1 mmol/L    Chloride 104 101 - 111 mmol/L   CO2 25 22 - 32 mmol/L   Glucose, Bld 73 65 - 99 mg/dL   BUN 9 6 - 20 mg/dL   Creatinine, Ser 4.09 0.44 - 1.00 mg/dL   Calcium 8.8 (L) 8.9 - 10.3 mg/dL   Total Protein 5.8 (L) 6.5 - 8.1 g/dL   Albumin 2.6 (L) 3.5 - 5.0 g/dL   AST 17 15 - 41 U/L   ALT 9 (L) 14 - 54 U/L   Alkaline Phosphatase 123 38 - 126 U/L   Total Bilirubin 0.5 0.3 - 1.2 mg/dL   GFR calc non Af Amer >60 >60 mL/min   GFR calc Af Amer >60 >60 mL/min   Anion gap 6 5 - 15  RPR   Collection Time: 02/19/17  5:52 PM  Result Value Ref Range   RPR Ser Ql Non Reactive Non Reactive  Rapid HIV screen (HIV 1/2 Ab+Ag) ([facility] Only)   Collection Time: 02/19/17  5:52 PM  Result Value Ref Range   HIV-1 P24 Antigen - HIV24 NON REACTIVE NON REACTIVE   HIV 1/2 Antibodies NON REACTIVE NON REACTIVE   Interpretation (HIV Ag Ab)      A non reactive test result means that HIV 1 or HIV 2 antibodies and HIV 1 p24 antigen were not detected in the specimen.  Type and screen [facility] [place]   Collection Time: 02/19/17  5:52 PM  Result Value Ref Range   ABO/RH(D) O NEG    Antibody Screen POS    Sample Expiration 02/22/2017    DAT, IgG NEG    Antibody Identification PASSIVELY ACQUIRED ANTI-D    Unit Number [id number]    Blood Component Type RED CELLS,LR    Unit division 00    Status of Unit ALLOCATED    Transfusion Status OK TO TRANSFUSE    Crossmatch Result COMPATIBLE    Unit Number [id number]    Blood Component Type RED CELLS,LR    Unit division 00    Status of Unit ALLOCATED    Transfusion Status OK TO TRANSFUSE    Crossmatch Result COMPATIBLE   BPAM RBC   Collection Time: 02/19/17  5:52 PM  Result Value Ref Range   Blood Product Unit Number [id number]    Unit Type and Rh 9500    Blood Product Expiration Date 272536644034    Blood Product Unit Number [id number]    Unit Type and Rh 9500    Blood Product Expiration Date [id number]   Prepare RBC   Collection Time:  02/19/17  8:30 PM  Result Value  Ref Range   Order Confirmation ORDER PROCESSED BY BLOOD BANK   Urinalysis, Routine w reflex microscopic   Collection Time: 02/19/17 11:23 PM  Result Value Ref Range   Color, Urine YELLOW YELLOW   APPearance CLEAR CLEAR   Specific Gravity, Urine 1.019 1.005 - 1.030   pH 6.0 5.0 - 8.0   Glucose, UA NEGATIVE NEGATIVE mg/dL   Hgb urine dipstick NEGATIVE NEGATIVE   Bilirubin Urine NEGATIVE NEGATIVE   Ketones, ur NEGATIVE NEGATIVE mg/dL   Protein, ur 960 (A) NEGATIVE mg/dL   Nitrite NEGATIVE NEGATIVE   Leukocytes, UA NEGATIVE NEGATIVE   RBC / HPF 0-5 0 - 5 RBC/hpf   WBC, UA 6-30 0 - 5 WBC/hpf   Bacteria, UA NONE SEEN NONE SEEN   Squamous Epithelial / LPF 0-5 (A) NONE SEEN   Trans Epithel, UA <1    Mucous PRESENT    Hyaline Casts, UA PRESENT     Imaging Studies:     Currently EPIC will not allow sonographic studies to automatically populate into notes.  In the meantime, copy and paste results into note or free text.  Medications:  Scheduled . docusate sodium  100 mg Oral Daily  . prenatal multivitamin  1 tablet Oral Q1200   I have reviewed the patient's current medications.  ASSESSMENT: Patient Active Problem List   Diagnosis Date Noted  . Vaginal bleeding 02/12/2017  . Vaginal bleeding in pregnancy 02/11/2017  . Vaginal bleeding in pregnancy, third trimester 02/11/2017    PLAN: Inpatient status with BID fetal monitoring til IOL at 37 wk.  Tilda Burrow 02/20/2017,11:11 AM    Patient ID: Brandy Wang, female   DOB: 1990-11-26, 25 y.o.   MRN: [id number]

## 2017-02-20 NOTE — Progress Notes (Signed)
IC'd, urinalysis collected and sent to lab.

## 2017-02-21 ENCOUNTER — Encounter (HOSPITAL_COMMUNITY): Payer: Self-pay | Admitting: Obstetrics and Gynecology

## 2017-02-21 DIAGNOSIS — Z9119 Patient's noncompliance with other medical treatment and regimen: Secondary | ICD-10-CM

## 2017-02-21 DIAGNOSIS — O4590 Premature separation of placenta, unspecified, unspecified trimester: Secondary | ICD-10-CM | POA: Diagnosis present

## 2017-02-21 DIAGNOSIS — Z91199 Patient's noncompliance with other medical treatment and regimen due to unspecified reason: Secondary | ICD-10-CM

## 2017-02-21 DIAGNOSIS — O09213 Supervision of pregnancy with history of pre-term labor, third trimester: Secondary | ICD-10-CM

## 2017-02-21 DIAGNOSIS — E876 Hypokalemia: Secondary | ICD-10-CM | POA: Clinically undetermined

## 2017-02-21 DIAGNOSIS — Z6791 Unspecified blood type, Rh negative: Secondary | ICD-10-CM

## 2017-02-21 DIAGNOSIS — Z8759 Personal history of other complications of pregnancy, childbirth and the puerperium: Secondary | ICD-10-CM

## 2017-02-21 DIAGNOSIS — O09893 Supervision of other high risk pregnancies, third trimester: Secondary | ICD-10-CM

## 2017-02-21 DIAGNOSIS — O34219 Maternal care for unspecified type scar from previous cesarean delivery: Secondary | ICD-10-CM | POA: Diagnosis present

## 2017-02-21 DIAGNOSIS — O26899 Other specified pregnancy related conditions, unspecified trimester: Secondary | ICD-10-CM

## 2017-02-21 DIAGNOSIS — O1403 Mild to moderate pre-eclampsia, third trimester: Secondary | ICD-10-CM | POA: Diagnosis not present

## 2017-02-21 LAB — PROTEIN / CREATININE RATIO, URINE
Creatinine, Urine: 103 mg/dL
Protein Creatinine Ratio: 0.64 mg/mg{Cre} — ABNORMAL HIGH (ref 0.00–0.15)
TOTAL PROTEIN, URINE: 66 mg/dL

## 2017-02-21 LAB — BASIC METABOLIC PANEL
ANION GAP: 5 (ref 5–15)
BUN: <5 mg/dL — ABNORMAL LOW (ref 6–20)
CALCIUM: 8.1 mg/dL — AB (ref 8.9–10.3)
CO2: 25 mmol/L (ref 22–32)
Chloride: 105 mmol/L (ref 101–111)
Creatinine, Ser: 0.51 mg/dL (ref 0.44–1.00)
GFR calc Af Amer: >60 mL/min (ref >60)
GLUCOSE: 71 mg/dL (ref 65–99)
Potassium: 3 mmol/L — ABNORMAL LOW (ref 3.5–5.1)
Sodium: 135 mmol/L (ref 135–145)

## 2017-02-21 LAB — COMPREHENSIVE METABOLIC PANEL
ALT: 7 U/L — AB (ref 14–54)
AST: 14 U/L — AB (ref 15–41)
Albumin: 2.3 g/dL — ABNORMAL LOW (ref 3.5–5.0)
Alkaline Phosphatase: 116 U/L (ref 38–126)
Anion gap: 6 (ref 5–15)
CHLORIDE: 106 mmol/L (ref 101–111)
CO2: 24 mmol/L (ref 22–32)
CREATININE: 0.45 mg/dL (ref 0.44–1.00)
Calcium: 8.2 mg/dL — ABNORMAL LOW (ref 8.9–10.3)
GFR calc Af Amer: >60 mL/min (ref >60)
GFR calc non Af Amer: >60 mL/min (ref >60)
Glucose, Bld: 64 mg/dL — ABNORMAL LOW (ref 65–99)
POTASSIUM: 3.1 mmol/L — AB (ref 3.5–5.1)
SODIUM: 136 mmol/L (ref 135–145)
Total Bilirubin: 0.6 mg/dL (ref 0.3–1.2)
Total Protein: 5.7 g/dL — ABNORMAL LOW (ref 6.5–8.1)

## 2017-02-21 LAB — CBC
HCT: 28.7 % — ABNORMAL LOW (ref 36.0–46.0)
HEMATOCRIT: 23.4 % — AB (ref 36.0–46.0)
Hemoglobin: 7.9 g/dL — ABNORMAL LOW (ref 12.0–15.0)
Hemoglobin: 9.6 g/dL — ABNORMAL LOW (ref 12.0–15.0)
MCH: 27.1 pg (ref 26.0–34.0)
MCH: 27 pg (ref 26.0–34.0)
MCHC: 33.8 g/dL (ref 30.0–36.0)
MCHC: 33.4 g/dL (ref 30.0–36.0)
MCV: 80.4 fL (ref 78.0–100.0)
MCV: 80.6 fL (ref 78.0–100.0)
Platelets: 217 10*3/uL (ref 150–400)
Platelets: 240 K/uL (ref 150–400)
RBC: 2.91 MIL/uL — AB (ref 3.87–5.11)
RBC: 3.56 MIL/uL — ABNORMAL LOW (ref 3.87–5.11)
RDW: 15.1 % (ref 11.5–15.5)
RDW: 14.9 % (ref 11.5–15.5)
WBC: 8.4 10*3/uL (ref 4.0–10.5)
WBC: 10 K/uL (ref 4.0–10.5)

## 2017-02-21 LAB — COMPREHENSIVE METABOLIC PANEL WITH GFR
ALT: 9 U/L — ABNORMAL LOW (ref 14–54)
AST: 16 U/L (ref 15–41)
Albumin: 2.4 g/dL — ABNORMAL LOW (ref 3.5–5.0)
Alkaline Phosphatase: 127 U/L — ABNORMAL HIGH (ref 38–126)
Anion gap: 7 (ref 5–15)
BUN: 6 mg/dL (ref 6–20)
CO2: 23 mmol/L (ref 22–32)
Calcium: 8.1 mg/dL — ABNORMAL LOW (ref 8.9–10.3)
Chloride: 107 mmol/L (ref 101–111)
Creatinine, Ser: 0.47 mg/dL (ref 0.44–1.00)
GFR calc Af Amer: >60 mL/min
GFR calc non Af Amer: >60 mL/min
Glucose, Bld: 98 mg/dL (ref 65–99)
Potassium: 3.1 mmol/L — ABNORMAL LOW (ref 3.5–5.1)
Sodium: 137 mmol/L (ref 135–145)
Total Bilirubin: 0.8 mg/dL (ref 0.3–1.2)
Total Protein: 6.3 g/dL — ABNORMAL LOW (ref 6.5–8.1)

## 2017-02-21 MED ORDER — SODIUM CHLORIDE 0.9 % IV BOLUS (SEPSIS)
500.0000 mL | Freq: Once | INTRAVENOUS | Status: AC
  Administered 2017-02-21 (×2): 500 mL via INTRAVENOUS

## 2017-02-21 MED ORDER — BUTALBITAL-APAP-CAFFEINE 50-325-40 MG PO TABS
2.0000 | ORAL_TABLET | Freq: Once | ORAL | Status: AC
  Administered 2017-02-21: 2 via ORAL
  Filled 2017-02-21: qty 2

## 2017-02-21 MED ORDER — ACETAMINOPHEN 325 MG PO TABS
650.0000 mg | ORAL_TABLET | Freq: Once | ORAL | Status: AC
  Administered 2017-02-21: 650 mg via ORAL
  Filled 2017-02-21: qty 2

## 2017-02-21 MED ORDER — DIPHENHYDRAMINE HCL 50 MG/ML IJ SOLN
12.5000 mg | Freq: Once | INTRAMUSCULAR | Status: AC
  Administered 2017-02-21: 12.5 mg via INTRAVENOUS
  Filled 2017-02-21: qty 1

## 2017-02-21 MED ORDER — POTASSIUM CHLORIDE CRYS ER 20 MEQ PO TBCR
30.0000 meq | EXTENDED_RELEASE_TABLET | Freq: Two times a day (BID) | ORAL | Status: AC
  Administered 2017-02-22: 10:00:00 30 meq via ORAL
  Filled 2017-02-21 (×6): qty 1

## 2017-02-21 MED ORDER — SODIUM CHLORIDE 0.9 % IV SOLN
Freq: Once | INTRAVENOUS | Status: AC
  Administered 2017-02-21: 10:00:00 via INTRAVENOUS

## 2017-02-21 MED ORDER — OXYCODONE HCL 5 MG PO TABS
10.0000 mg | ORAL_TABLET | Freq: Once | ORAL | Status: AC | PRN
  Administered 2017-02-22: 10 mg via ORAL
  Filled 2017-02-21: qty 2

## 2017-02-21 MED ORDER — POTASSIUM CHLORIDE CRYS ER 20 MEQ PO TBCR
30.0000 meq | EXTENDED_RELEASE_TABLET | Freq: Once | ORAL | Status: DC
  Filled 2017-02-21: qty 1

## 2017-02-21 NOTE — Progress Notes (Signed)
Pt requested to be taken off the EFM because it's preventing her from sleeping and her pain is gone. Pt ambulated to the bathroom, voided, denies any bleeding or vaginal discharge. Pt returned to bed EFM d/c'd, and pt settled comfortable to bed.

## 2017-02-21 NOTE — Progress Notes (Signed)
Dr. Vergie LivingPickens notified of patient's continued headache and protein/creatine ratio of 0.64. See new orders.

## 2017-02-21 NOTE — Progress Notes (Addendum)
OB Note  Patient states HA much improved with the fioricet and not wanting anything else right now for it. PC ratio + and RN states it looked like no blood was in it so will give pt dx of at least mild pre-eclampsia for now. CBC and CMP this AM normal except for anemia, around the time she states she had HA. Will order post transfusion labs for 2100 and see how pt is at that time. BPs mild range only and no concerning s/s.   Cornelia Copaharlie Collen Vincent, Jr MD Attending [facility] for [facility] ([facility]ulty [facility]) 02/21/2017 Time: [phone]

## 2017-02-21 NOTE — Progress Notes (Signed)
Antepartum Progress Note  D/w pt re: situation  Anemia: d/w her that she is low already and high risk for bleeding and recommend transfusing the two units of PRBCs that she already has crossmatched. After d/w her r/b/a, she is amenable to this. CBC is slight dropped vs within the margin of error and likely the latter  CBC Latest Ref Rng & Units 02/21/2017 02/19/2017 02/12/2017  WBC 4.0 - 10.5 K/uL 8.4 9.7 13.6(H)  Hemoglobin 12.0 - 15.0 g/dL 7.9(L) 8.2(L) 8.0(L)  Hematocrit 36.0 - 46.0 % 23.4(L) 24.7(L) 23.7(L)  Platelets 150 - 400 K/uL 217 253 234   Delivery mode: d/w her re: risks with TOLAC and risk of worsening abruption if she does TOLAC, given the size of her abruption and possibility of prolonged labor given need for induction vs her already being in labor. Given extreme risks of maternal/fetal morbiditiy and mortality with h/o prior c-section and likely ongoing abruption, recommend c-section. Pt states that she had some minimal brown d/c when she went to use the bathroom this AM, no pain. Patient would like a repeat c-section  Delivery timing: d/w Dr. Sherrie Georgeecker re: delivery timing, in regards to patient's history, clinic course and current s/s, labs and she recommends delivery at 37/0wks. Will keep patient inpatient until and call the OR to schedule.   Brandy Wang, Jr MD Attending [facility] 02/21/2017 Time: 1003am

## 2017-02-21 NOTE — Progress Notes (Signed)
FACULTY PRACTICE ANTEPARTUM(COMPREHENSIVE) NOTE  Brandy Wang Montero is a 26 y.o. [phone] at 5214w4d  who is admitted for marginal abruption, with 4-5 cm left sided clot near the edge of the placenta .   Fetal presentation is cephalic. Length of Stay:  2  Days  Subjective: Patient had an episode of abdominal pain, "when the nurse came in the room" briefly rated a 10/10 by pt, without any bleeding. Pain quickly resolved. FHR monitoring showed the FHR Cat I ,reactive with no contractions. Pt demanded the fetal monitor be removed so she could sleep. Patient reports the fetal movement as active. Patient reports uterine contraction  activity as none. Patient reports  vaginal bleeding as scant staining. Patient describes fluid per vagina as None. Patient received her Rhogam last evening. Vitals:  Blood pressure 137/86, pulse 66, temperature 98.4 F (36.9 C), temperature source Oral, resp. rate 16, height 5' 7.5" (1.715 m), weight 75.2 kg (165 lb 12 oz), SpO2 100 %. Physical Examination:  General appearance - alert, well appearing, and in no distress and normal appearing weight Heart - normal rate and regular rhythm Abdomen - soft, nontender, nondistended Fundal Height:  size equals dates Cervical Exam: Not evaluated. and  Extremities normal, atraumatic, no cyanosis or edema and Homans sign is negative, no sign of DVT with DTRs 2+ bilaterally Membranes:intact  Fetal Monitoring:  Baseline: 155 bpm, Variability: Good {> 6 bpm), Accelerations: Reactive and Decelerations: Absent No contractions Labs:  No results found for this or any previous visit (from the past 24 hour(s)).  Imaging Studies:    Marland Kitchen.  Medications:  Scheduled . docusate sodium  100 mg Oral Daily  . prenatal multivitamin  1 tablet Oral Q1200   I have reviewed the patient's current medications.  ASSESSMENT: Patient Active Problem List   Diagnosis Date Noted  . Vaginal bleeding 02/12/2017  . Vaginal bleeding in pregnancy 02/11/2017   . Vaginal bleeding in pregnancy, third trimester 02/11/2017  Marginal abruption Rh negative.with + KB, given Rhogam 1 unit (300 mcg)  PLAN: Continue inpatient care, NST q shift,  IOL scheduled for 37 wks, posted for midnight at 37.0 ( Thursday)  Christin BachFERGUSON,Matsuko Kretz V 02/21/2017,5:49 AM    Patient ID: Brandy Wang Jian, female   DOB: Jan 19, 1991, 26 y.o.   MRN: [id number]

## 2017-02-21 NOTE — Progress Notes (Signed)
OB Note  Patient states HA continues to improve. No visual s/s. Patient noting some UCs and still had some brown spotting with wiping. D/w pt re: pre-x w/o severe features dx and severe s/s to let us know about. BPs normal to barely mild range. Also d/w her re: normal to have some UCs qhs but true labor s/s d/w pt. Will do 1/2 L NS bolus since she has IV access.   Cornelia Copaharlie Nautia Lem, Jr MD Attending [facility] 02/21/2017 Time: 2205

## 2017-02-21 NOTE — Progress Notes (Signed)
   02/21/17 0205  Vital Signs  BP 137/86  BP Location Left Arm  Patient Position (if appropriate) Right side lying/tilt  BP Method Automatic  Pulse Rate 66  Pulse Rate Source Monitor  Resp 16  Temp 98.4 F (36.9 C)  Temp Source Oral  Hourly Rounding  Assessment Alert  Intervention Call light w/in reach;Comfort measures;Environment secured;Family sitting w/ patient  OB Interventions  Interventions Toco adjusted;US adjusted  Patient c/o left side pain 10/10 which started suddenly when writer entered her room. Pt stated that she was lying on her left side for sometime and as soon as she turned she felt a sudden pain to her  left side. Pt denies any contraction or any new vaginal bleed. Pt was placed on EFM, VSS, MD notified and an order received for EFM for  1 hr or until pain subside.

## 2017-02-22 DIAGNOSIS — O4593 Premature separation of placenta, unspecified, third trimester: Principal | ICD-10-CM

## 2017-02-22 LAB — RH IG WORKUP (INCLUDES ABO/RH)
ABO/RH(D): O NEG
Fetal Screen: NEGATIVE
GESTATIONAL AGE(WKS): 36
Unit division: 0

## 2017-02-22 LAB — BPAM RBC
BLOOD PRODUCT EXPIRATION DATE: 201807022359
Blood Product Expiration Date: 201807022359
ISSUE DATE / TIME: 201806111034
ISSUE DATE / TIME: 201806111401
UNIT TYPE AND RH: 9500
UNIT TYPE AND RH: 9500

## 2017-02-22 LAB — CBC
HEMATOCRIT: 27.6 % — AB (ref 36.0–46.0)
HEMOGLOBIN: 9.4 g/dL — AB (ref 12.0–15.0)
MCH: 27.5 pg (ref 26.0–34.0)
MCHC: 34.1 g/dL (ref 30.0–36.0)
MCV: 80.7 fL (ref 78.0–100.0)
PLATELETS: 226 10*3/uL (ref 150–400)
RBC: 3.42 MIL/uL — AB (ref 3.87–5.11)
RDW: 15.2 % (ref 11.5–15.5)
WBC: 11 10*3/uL — AB (ref 4.0–10.5)

## 2017-02-22 LAB — TYPE AND SCREEN
ABO/RH(D): O NEG
Antibody Screen: POSITIVE
DAT, IGG: NEGATIVE
UNIT DIVISION: 0
Unit division: 0

## 2017-02-22 MED ORDER — OXYCODONE HCL 5 MG PO TABS: 10.0000 mg | ORAL_TABLET | Freq: Four times a day (QID) | ORAL | Status: DC | PRN

## 2017-02-22 MED ORDER — GENTAMICIN SULFATE 40 MG/ML IJ SOLN: Freq: Once | INTRAVENOUS | Status: DC

## 2017-02-22 MED ORDER — OXYCODONE HCL 5 MG PO TABS
10.0000 mg | ORAL_TABLET | Freq: Once | ORAL | Status: AC
  Administered 2017-02-22: 10 mg via ORAL
  Filled 2017-02-22: qty 2

## 2017-02-22 MED ORDER — GENTAMICIN SULFATE 40 MG/ML IJ SOLN
Freq: Once | INTRAVENOUS | Status: AC
  Administered 2017-02-23: 12:00:00 via INTRAVENOUS
  Filled 2017-02-22: qty 8.5

## 2017-02-22 MED ORDER — OXYCODONE HCL 5 MG PO TABS
10.0000 mg | ORAL_TABLET | Freq: Four times a day (QID) | ORAL | Status: AC | PRN
Start: 2017-02-22 — End: 2017-02-22
  Administered 2017-02-22: 10 mg via ORAL
  Filled 2017-02-22: qty 2

## 2017-02-22 NOTE — Progress Notes (Signed)
On review of chart, patient was scheduled for RCS tomorrow on 02/23/17 at 5330w6d.  Patient was informed of the need to move this to 4552w0d on 02/24/17. She was upset, and I apologized profusely for this error.  RCS re-scheduled on 02/24/17 at 1100.  Preoperative orders placed. Patient assured that if she started to have concerning bleeding or other maternal-fetal concerns, her delivery may need to occur earlier.  She concurred with this plan.   Brandy CollinsUgonna Shandee Jergens, MD

## 2017-02-22 NOTE — Progress Notes (Signed)
Pt called out wanting to see her nurse, went into her room found her sitting up crying in bed, c/o nausea and wanting a coke a cola but cafeteria is closed for night and pt only has a $20.00 bill, no one on unit has change.   She c/o she asked day shift nurse for pads and she doesn't have any.   Pt c/o nausea wants coke to help with her stomach refusing all medicine till she gets a coke if she doesn't get a coke she is signing out AMA.

## 2017-02-22 NOTE — Progress Notes (Signed)
Daily Antepartum Note  Admission Date: 02/19/2017 Current Date: 02/22/2017 7:13 AM  Brandy Wang is a 26 y.o. [id number] @ 7931w5d, HD#4, re-admitted for abruption, VB.  Pregnancy complicated by: Patient Active Problem List   Diagnosis Date Noted  . Antepartum placental abruption 02/21/2017  . Rh negative state in antepartum period 02/21/2017  . History of cesarean delivery affecting pregnancy 02/21/2017  . History of severe pre-eclampsia 02/21/2017  . History of preterm delivery, currently pregnant in third trimester 02/21/2017  . Patient noncompliance 02/21/2017  . Mild pre-eclampsia in third trimester 02/21/2017  . Hypokalemia 02/21/2017  . Vaginal bleeding 02/12/2017  . Vaginal bleeding in pregnancy 02/11/2017  . Vaginal bleeding in pregnancy, third trimester 02/11/2017    Overnight/24hr events:  none  Subjective:  Pt slept fine o/n. No spotting this AM, no s/s of preterm labor or preeclampsia  Objective:    Current Vital Signs 24h Vital Sign Ranges  T 98.6 F (37 C) Temp  Avg: 98.4 F (36.9 C)  Min: 98.2 F (36.8 C)  Max: 98.7 F (37.1 C)  BP 126/80 BP  Min: 126/80  Max: 149/99  HR 72 Pulse  Avg: 78.8  Min: 68  Max: 94  RR 16 Resp  Avg: 16  Min: 16  Max: 16  SaO2 98 % Not Delivered SpO2  Avg: 97.1 %  Min: 95 %  Max: 98 %       24 Hour I/O Current Shift I/O  Time Ins Outs 06/11 0701 - 06/12 0700 In: 715 [I.V.:49] Out: 0  No intake/output data recorded.   Patient Vitals for the past 24 hrs:  BP Temp Temp src Pulse Resp SpO2  02/22/17 0351 126/80 98.6 F (37 C) - 72 16 -  02/21/17 2356 (!) 148/86 98.3 F (36.8 C) Oral 87 16 98 %  02/21/17 2313 139/89 - - 74 - -  02/21/17 2111 (!) 128/93 - - 77 - -  02/21/17 2039 (!) 134/100 98.3 F (36.8 C) Oral 68 16 -  02/21/17 1650 131/75 98.7 F (37.1 C) Oral 85 16 98 %  02/21/17 1551 140/90 98.3 F (36.8 C) Oral 89 16 97 %  02/21/17 1427 130/77 98.6 F (37 C) Oral 84 16 97 %  02/21/17 1357 (!) 141/94 98.3 F (36.8 C)  - 94 16 95 %  02/21/17 1224 139/86 98.5 F (36.9 C) Oral 74 16 95 %  02/21/17 1109 (!) 149/99 98.6 F (37 C) Oral 73 16 98 %  02/21/17 1032 (!) 144/82 98.2 F (36.8 C) Oral 73 16 98 %  02/21/17 0748 133/86 98.3 F (36.8 C) Oral 75 16 98 %   FHT 140 baseline, +accels, no decels, mod variability, toco quiet x 752m 145 baseline, +accels, no decels, mod variability, toco quiet x 5052m 145 baseline, +accels, no decels, mod variability, toco quiet x 1952m  Physical exam: General: Well nourished, well developed female in no acute distress. Abdomen: gravid, nttp Cardiovascular: S1, S2 normal, no murmur, rub or gallop, regular rate and rhythm Respiratory: CTAB Extremities: no clubbing, cyanosis or edema Skin: Warm and dry.   Medications: Current Facility-Administered Medications  Medication Dose Route Frequency Provider Last Rate Last Dose  . acetaminophen (TYLENOL) tablet 650 mg  650 mg Oral Q4H PRN Montez MoritaLawson, Marie D, CNM      . calcium carbonate (TUMS - dosed in mg elemental calcium) chewable tablet 400 mg of elemental calcium  2 tablet Oral Q4H PRN Montez MoritaLawson, Marie D, CNM      .  potassium chloride (K-DUR,KLOR-CON) CR tablet 30 mEq  30 mEq Oral Once [place] Bing, MD      . potassium chloride (K-DUR,KLOR-CON) CR tablet 30 mEq  30 mEq Oral BID AC [place] Bing, MD      . prenatal multivitamin tablet 1 tablet  1 tablet Oral Q1200 Montez Morita, CNM   1 tablet at 02/21/17 1259    Labs:   Recent Labs Lab 02/21/17 0911 02/21/17 2052 02/22/17 0539  WBC 8.4 10.0 11.0*  HGB 7.9* 9.6* 9.4*  HCT 23.4* 28.7* 27.6*  PLT 217 240 226     Recent Labs Lab 02/19/17 1752 02/21/17 0911 02/21/17 2052  NA 135 136  135 137  K 3.3* 3.1*  3.0* 3.1*  CL 104 106  105 107  CO2 25 24  25 23   BUN 9 <5*  <5* 6  CREATININE 0.58 0.45  0.51 0.47  CALCIUM 8.8* 8.2*  8.1* 8.1*  PROT 5.8* 5.7* 6.3*  BILITOT 0.5 0.6 0.8  ALKPHOS 123 116 127*  ALT 9* 7* 9*  AST 17 14* 16  GLUCOSE 73 64*  71  98    Radiology: no new imaging  Assessment & Plan:  Pt stable *Pregnancy: routine care. Bid NSTs *abruption: d/w MFM. Delivery at 37wks. *Rh neg: s/p rhogam x 1 (KB + for 1). Rpt rhogam PP PRN *gHTN: dx on 6/11. Negative labs. BPs mild range only and no s/s.  *Anemia: s/p 2U PRBC on 6/11 *h/o prior c-section: pt desires rpt and scheduled for 37/0. Pt declines BTL *Preterm: no issues currently. S/p bmz on 6/1 and 6/2 *PPx: SCDs, OOB ad lib *FEN/GI: regular diet *Dispo: delivery at 37/0 wcheduled  Cornelia Copa MD Attending Center for [facility])

## 2017-02-23 DIAGNOSIS — O34219 Maternal care for unspecified type scar from previous cesarean delivery: Secondary | ICD-10-CM

## 2017-02-23 DIAGNOSIS — O1403 Mild to moderate pre-eclampsia, third trimester: Secondary | ICD-10-CM

## 2017-02-23 MED ORDER — PROMETHAZINE HCL 25 MG/ML IJ SOLN
12.5000 mg | Freq: Once | INTRAMUSCULAR | Status: AC
  Administered 2017-02-23: 12.5 mg via INTRAVENOUS
  Filled 2017-02-23: qty 1

## 2017-02-23 MED ORDER — PROMETHAZINE HCL 25 MG/ML IJ SOLN
25.0000 mg | Freq: Four times a day (QID) | INTRAMUSCULAR | Status: DC | PRN
  Administered 2017-02-23 (×2): 25 mg via INTRAVENOUS
  Filled 2017-02-23 (×2): qty 1

## 2017-02-23 MED ORDER — LACTATED RINGERS IV BOLUS (SEPSIS)
500.0000 mL | Freq: Once | INTRAVENOUS | Status: AC
  Administered 2017-02-23: 500 mL via INTRAVENOUS

## 2017-02-23 MED ORDER — DIPHENHYDRAMINE HCL 50 MG/ML IJ SOLN
12.5000 mg | Freq: Once | INTRAMUSCULAR | Status: AC
  Administered 2017-02-23: 12.5 mg via INTRAVENOUS
  Filled 2017-02-23: qty 1

## 2017-02-23 MED ORDER — FENTANYL CITRATE (PF) 100 MCG/2ML IJ SOLN
100.0000 ug | Freq: Once | INTRAMUSCULAR | Status: AC
  Administered 2017-02-23: 100 ug via INTRAVENOUS
  Filled 2017-02-23: qty 2

## 2017-02-23 NOTE — Progress Notes (Signed)
Patient ID: Brandy Wang, female   DOB: 05/07/1991, 26 y.o.   MRN: [id number] FACULTY PRACTICE ANTEPARTUM(COMPREHENSIVE) NOTE  Brandy Wang is a 26 y.o. [phone] at [id number]  who is admitted for marginal abruption, with 4-5 cm left sided clot near the edge of the placenta .   Fetal presentation is cephalic. Length of Stay:  4  Days  Subjective: Patient reports complete resolution of her headache last night Patient reports the fetal movement as active. Patient reports uterine contraction  activity as none. Patient reports  vaginal bleeding as scant staining. Patient describes fluid per vagina as None.  Vitals:  Blood pressure 140/85, pulse 74, temperature 98.6 F (37 C), temperature source Oral, resp. rate 18, height 5' 7.5" (1.715 m), weight 165 lb 12 oz (75.2 kg), SpO2 100 %. Physical Examination:  General appearance - alert, well appearing, and in no distress and normal appearing weight Heart - normal rate and regular rhythm Abdomen - soft, nontender, nondistended Fundal Height:  size equals dates Cervical Exam: Not evaluated. and  Extremities normal, atraumatic, no cyanosis or edema and Homans sign is negative, no sign of DVT with DTRs 2+ bilaterally Membranes:intact  Fetal Monitoring:  Baseline: 130 bpm, Variability: Good {> 6 bpm), Accelerations: Reactive and Decelerations: Absent x 3 No contractions Labs:  No results found for this or any previous visit (from the past 24 hour(s)).  Imaging Studies:    Marland Kitchen.  Medications:  Scheduled . potassium chloride  30 mEq Oral Once  . potassium chloride  30 mEq Oral BID AC  . prenatal multivitamin  1 tablet Oral Q1200   I have reviewed the patient's current medications.  ASSESSMENT: Patient Active Problem List   Diagnosis Date Noted  . Antepartum placental abruption 02/21/2017  . Rh negative state in antepartum period 02/21/2017  . History of cesarean delivery affecting pregnancy 02/21/2017  . History of severe pre-eclampsia  02/21/2017  . History of preterm delivery, currently pregnant in third trimester 02/21/2017  . Patient noncompliance 02/21/2017  . Mild pre-eclampsia in third trimester 02/21/2017  . Hypokalemia 02/21/2017  . Vaginal bleeding 02/12/2017  . Vaginal bleeding in pregnancy 02/11/2017  . Vaginal bleeding in pregnancy, third trimester 02/11/2017  Marginal abruption Rh negative.with + KB, given Rhogam 1 unit (300 mcg)  PLAN: Continue inpatient care, NST q shift,  Continue monitoring for worsening si/sx of preeclampsia Plan for delivery by RCS on 6/14 at 11 am Patient received rhogam  Adam Sanjuan 02/23/2017,6:53 AM

## 2017-02-23 NOTE — Progress Notes (Signed)
Dr. Omer JackMumaw on floor to see pt. Pt frustrated vomited x2 and has a headache wants to sign out AMA. Dr.Mumaw at bedside talking with pt.  See orders

## 2017-02-23 NOTE — Progress Notes (Signed)
Patient ID: Brandy Wang, female   DOB: 1990/10/14, 26 y.o.   MRN: [id number]  Called to patient's room due to patient wanted to leave AMA. Sat down with patient, she was in tears. She states she is frustrated due to she has been throwing up today and and has a bad headache but threw up the Percocet after receiving it. Discussed with patient at length regarding situation and concern for going home and the health of the baby and her, especially the concern that baby could die if she has another big bleed or if her GHTN progresses to preeclampsia and worsens the abruption or other scenarios. Patient understands and is willing to try IV medication for her nausea and her headache.    Vitals:   02/22/17 1156 02/22/17 1738 02/22/17 1958 02/22/17 2306  BP: (!) 143/92 137/81 (!) 132/99 (!) 145/93  Pulse: 97 70 87 67  Resp: 16 16 18 18   Temp: 98.7 F (37.1 C) 98.5 F (36.9 C) 98.5 F (36.9 C) 98.5 F (36.9 C)  TempSrc: Oral Oral Oral Oral  SpO2: 97% 97% 98% 97%  Weight:      Height:         Will give IV Benadryl and Phenergan.  If her headache does not resolve, will repeat preE labs, as symptoms may be progressing [facility] pre[facility].  Cleda ClarksElizabeth W. Mumaw, DO  OB Fellow [facility], [facility]

## 2017-02-24 ENCOUNTER — Inpatient Hospital Stay (HOSPITAL_COMMUNITY): Payer: Medicaid Other | Admitting: Anesthesiology

## 2017-02-24 ENCOUNTER — Encounter (HOSPITAL_COMMUNITY): Payer: Self-pay | Admitting: *Deleted

## 2017-02-24 ENCOUNTER — Encounter (HOSPITAL_COMMUNITY)
Admission: AD | Discharge status: Against Medical Advice | Payer: Self-pay | Source: Ambulatory Visit | Attending: Obstetrics & Gynecology

## 2017-02-24 ENCOUNTER — Encounter (HOSPITAL_COMMUNITY): Payer: Medicaid Other

## 2017-02-24 DIAGNOSIS — O34211 Maternal care for low transverse scar from previous cesarean delivery: Secondary | ICD-10-CM

## 2017-02-24 DIAGNOSIS — O4593 Premature separation of placenta, unspecified, third trimester: Secondary | ICD-10-CM

## 2017-02-24 DIAGNOSIS — Z3A37 37 weeks gestation of pregnancy: Secondary | ICD-10-CM

## 2017-02-24 LAB — CBC
HEMATOCRIT: 31.5 % — AB (ref 36.0–46.0)
HEMATOCRIT: 29.6 % — AB (ref 36.0–46.0)
HEMOGLOBIN: 9.8 g/dL — AB (ref 12.0–15.0)
Hemoglobin: 10.1 g/dL — ABNORMAL LOW (ref 12.0–15.0)
MCH: 26 pg (ref 26.0–34.0)
MCH: 27.1 pg (ref 26.0–34.0)
MCHC: 32.1 g/dL (ref 30.0–36.0)
MCHC: 33.1 g/dL (ref 30.0–36.0)
MCV: 81 fL (ref 78.0–100.0)
MCV: 81.8 fL (ref 78.0–100.0)
PLATELETS: 220 10*3/uL (ref 150–400)
Platelets: 230 10*3/uL (ref 150–400)
RBC: 3.89 MIL/uL (ref 3.87–5.11)
RBC: 3.62 MIL/uL — AB (ref 3.87–5.11)
RDW: 15.9 % — AB (ref 11.5–15.5)
RDW: 16.1 % — ABNORMAL HIGH (ref 11.5–15.5)
WBC: 13.2 10*3/uL — ABNORMAL HIGH (ref 4.0–10.5)
WBC: 19.3 10*3/uL — AB (ref 4.0–10.5)

## 2017-02-24 LAB — COMPREHENSIVE METABOLIC PANEL
ALT: 9 U/L — AB (ref 14–54)
AST: 18 U/L (ref 15–41)
Albumin: 2.3 g/dL — ABNORMAL LOW (ref 3.5–5.0)
Alkaline Phosphatase: 130 U/L — ABNORMAL HIGH (ref 38–126)
Anion gap: 4 — ABNORMAL LOW (ref 5–15)
BUN: 5 mg/dL — ABNORMAL LOW (ref 6–20)
CHLORIDE: 108 mmol/L (ref 101–111)
CO2: 23 mmol/L (ref 22–32)
Calcium: 8 mg/dL — ABNORMAL LOW (ref 8.9–10.3)
Creatinine, Ser: 0.49 mg/dL (ref 0.44–1.00)
Glucose, Bld: 91 mg/dL (ref 65–99)
POTASSIUM: 3.3 mmol/L — AB (ref 3.5–5.1)
SODIUM: 135 mmol/L (ref 135–145)
Total Bilirubin: 0.3 mg/dL (ref 0.3–1.2)
Total Protein: 5.8 g/dL — ABNORMAL LOW (ref 6.5–8.1)

## 2017-02-24 SURGERY — Surgical Case
Anesthesia: Regional

## 2017-02-24 MED ORDER — BUPIVACAINE IN DEXTROSE 0.75-8.25 % IT SOLN
INTRATHECAL | Status: AC
  Filled 2017-02-24: qty 2

## 2017-02-24 MED ORDER — SIMETHICONE 80 MG PO CHEW
80.0000 mg | CHEWABLE_TABLET | ORAL | Status: DC | PRN
Start: 2017-02-24 — End: 2017-02-26

## 2017-02-24 MED ORDER — KETOROLAC TROMETHAMINE 30 MG/ML IJ SOLN
INTRAMUSCULAR | Status: AC
  Filled 2017-02-24: qty 1

## 2017-02-24 MED ORDER — MORPHINE SULFATE (PF) 0.5 MG/ML IJ SOLN
INTRAMUSCULAR | Status: AC
  Filled 2017-02-24: qty 10

## 2017-02-24 MED ORDER — LABETALOL HCL 5 MG/ML IV SOLN
INTRAVENOUS | Status: AC
  Filled 2017-02-24: qty 8

## 2017-02-24 MED ORDER — SOD CITRATE-CITRIC ACID 500-334 MG/5ML PO SOLN
ORAL | Status: AC
  Administered 2017-02-24: 30 mL
  Filled 2017-02-24: qty 15

## 2017-02-24 MED ORDER — NALOXONE HCL 2 MG/2ML IJ SOSY
1.0000 ug/kg/h | PREFILLED_SYRINGE | INTRAVENOUS | Status: DC | PRN
  Filled 2017-02-24: qty 2

## 2017-02-24 MED ORDER — COCONUT OIL OIL: 1.0000 "application " | TOPICAL_OIL | Status: DC | PRN

## 2017-02-24 MED ORDER — IBUPROFEN 600 MG PO TABS
600.0000 mg | ORAL_TABLET | Freq: Four times a day (QID) | ORAL | Status: DC
  Administered 2017-02-24: 600 mg via ORAL
  Filled 2017-02-24 (×3): qty 1

## 2017-02-24 MED ORDER — BUPIVACAINE HCL (PF) 0.5 % IJ SOLN
INTRAMUSCULAR | Status: DC | PRN
  Administered 2017-02-24: 30 mL

## 2017-02-24 MED ORDER — MORPHINE SULFATE (PF) 4 MG/ML IV SOLN
1.0000 mg | INTRAVENOUS | Status: DC | PRN
  Administered 2017-02-24 (×2): 2 mg via INTRAVENOUS

## 2017-02-24 MED ORDER — MEPERIDINE HCL 25 MG/ML IJ SOLN: 6.2500 mg | INTRAMUSCULAR | Status: DC | PRN

## 2017-02-24 MED ORDER — MORPHINE SULFATE (PF) 4 MG/ML IV SOLN
INTRAVENOUS | Status: AC
  Filled 2017-02-24: qty 1

## 2017-02-24 MED ORDER — LACTATED RINGERS IV SOLN: INTRAVENOUS | Status: DC

## 2017-02-24 MED ORDER — DEXAMETHASONE SODIUM PHOSPHATE 4 MG/ML IJ SOLN
INTRAMUSCULAR | Status: AC
  Filled 2017-02-24: qty 1

## 2017-02-24 MED ORDER — WITCH HAZEL-GLYCERIN EX PADS: 1.0000 "application " | MEDICATED_PAD | CUTANEOUS | Status: DC | PRN

## 2017-02-24 MED ORDER — NALOXONE HCL 0.4 MG/ML IJ SOLN: 0.4000 mg | INTRAMUSCULAR | Status: DC | PRN

## 2017-02-24 MED ORDER — SIMETHICONE 80 MG PO CHEW
80.0000 mg | CHEWABLE_TABLET | Freq: Three times a day (TID) | ORAL | Status: DC
  Administered 2017-02-24 – 2017-02-26 (×5): 80 mg via ORAL
  Filled 2017-02-24 (×6): qty 1

## 2017-02-24 MED ORDER — OXYTOCIN 10 UNIT/ML IJ SOLN
INTRAMUSCULAR | Status: AC
  Filled 2017-02-24: qty 4

## 2017-02-24 MED ORDER — BUPIVACAINE HCL (PF) 0.5 % IJ SOLN
INTRAMUSCULAR | Status: AC
  Filled 2017-02-24: qty 30

## 2017-02-24 MED ORDER — PHENYLEPHRINE 8 MG IN D5W 100 ML (0.08MG/ML) PREMIX OPTIME
INJECTION | INTRAVENOUS | Status: AC
  Filled 2017-02-24: qty 100

## 2017-02-24 MED ORDER — SENNOSIDES-DOCUSATE SODIUM 8.6-50 MG PO TABS
2.0000 | ORAL_TABLET | ORAL | Status: DC
  Administered 2017-02-26: 2 via ORAL
  Filled 2017-02-24: qty 2

## 2017-02-24 MED ORDER — DIPHENHYDRAMINE HCL 25 MG PO CAPS
25.0000 mg | ORAL_CAPSULE | ORAL | Status: DC | PRN
  Administered 2017-02-24 – 2017-02-25 (×2): 25 mg via ORAL
  Filled 2017-02-24 (×3): qty 1

## 2017-02-24 MED ORDER — METOCLOPRAMIDE HCL 5 MG/ML IJ SOLN
INTRAMUSCULAR | Status: AC
  Filled 2017-02-24: qty 2

## 2017-02-24 MED ORDER — GENTAMICIN SULFATE 40 MG/ML IJ SOLN
INTRAMUSCULAR | Status: AC
  Administered 2017-02-24: 114.5 mL via INTRAVENOUS
  Filled 2017-02-24: qty 8.5

## 2017-02-24 MED ORDER — OXYTOCIN 40 UNITS IN LACTATED RINGERS INFUSION - SIMPLE MED: 2.5000 [IU]/h | INTRAVENOUS | Status: AC

## 2017-02-24 MED ORDER — HYDRALAZINE HCL 20 MG/ML IJ SOLN: 10.0000 mg | Freq: Once | INTRAMUSCULAR | Status: DC | PRN

## 2017-02-24 MED ORDER — DIPHENHYDRAMINE HCL 50 MG/ML IJ SOLN: 12.5000 mg | INTRAMUSCULAR | Status: DC | PRN

## 2017-02-24 MED ORDER — DIPHENHYDRAMINE HCL 50 MG/ML IJ SOLN
INTRAMUSCULAR | Status: AC
  Filled 2017-02-24: qty 1

## 2017-02-24 MED ORDER — SIMETHICONE 80 MG PO CHEW
80.0000 mg | CHEWABLE_TABLET | ORAL | Status: DC
  Administered 2017-02-26: 80 mg via ORAL
  Filled 2017-02-24: qty 1

## 2017-02-24 MED ORDER — ZOLPIDEM TARTRATE 5 MG PO TABS: 5.0000 mg | ORAL_TABLET | Freq: Every evening | ORAL | Status: DC | PRN

## 2017-02-24 MED ORDER — PHENYLEPHRINE 8 MG IN D5W 100 ML (0.08MG/ML) PREMIX OPTIME
INJECTION | INTRAVENOUS | Status: DC | PRN
  Administered 2017-02-24: 60 ug/min via INTRAVENOUS

## 2017-02-24 MED ORDER — TETANUS-DIPHTH-ACELL PERTUSSIS 5-2.5-18.5 LF-MCG/0.5 IM SUSP: 0.5000 mL | Freq: Once | INTRAMUSCULAR | Status: DC

## 2017-02-24 MED ORDER — FENTANYL CITRATE (PF) 100 MCG/2ML IJ SOLN
INTRAMUSCULAR | Status: AC
  Filled 2017-02-24: qty 2

## 2017-02-24 MED ORDER — NALBUPHINE HCL 10 MG/ML IJ SOLN: 5.0000 mg | INTRAMUSCULAR | Status: DC | PRN

## 2017-02-24 MED ORDER — LABETALOL HCL 5 MG/ML IV SOLN
INTRAVENOUS | Status: AC
Start: 2017-02-24 — End: 2017-02-25
  Filled 2017-02-24: qty 4

## 2017-02-24 MED ORDER — ACETAMINOPHEN 325 MG PO TABS: 650.0000 mg | ORAL_TABLET | ORAL | Status: DC | PRN

## 2017-02-24 MED ORDER — ONDANSETRON HCL 4 MG/2ML IJ SOLN
4.0000 mg | Freq: Three times a day (TID) | INTRAMUSCULAR | Status: DC | PRN
Start: 2017-02-24 — End: 2017-02-26

## 2017-02-24 MED ORDER — BUPIVACAINE IN DEXTROSE 0.75-8.25 % IT SOLN
INTRATHECAL | Status: DC | PRN
  Administered 2017-02-24: 1.6 mL via INTRATHECAL

## 2017-02-24 MED ORDER — LABETALOL HCL 5 MG/ML IV SOLN
20.0000 mg | INTRAVENOUS | Status: DC | PRN
  Administered 2017-02-24: 40 mg via INTRAVENOUS
  Administered 2017-02-24: 20 mg via INTRAVENOUS

## 2017-02-24 MED ORDER — MIDAZOLAM HCL 2 MG/2ML IJ SOLN: 0.5000 mg | Freq: Once | INTRAMUSCULAR | Status: DC | PRN

## 2017-02-24 MED ORDER — OXYCODONE-ACETAMINOPHEN 5-325 MG PO TABS
1.0000 | ORAL_TABLET | Freq: Four times a day (QID) | ORAL | Status: DC | PRN
  Administered 2017-02-24 – 2017-02-26 (×7): 2 via ORAL
  Filled 2017-02-24 (×7): qty 2

## 2017-02-24 MED ORDER — KETOROLAC TROMETHAMINE 30 MG/ML IJ SOLN
30.0000 mg | Freq: Four times a day (QID) | INTRAMUSCULAR | Status: AC | PRN
  Filled 2017-02-24: qty 1

## 2017-02-24 MED ORDER — METOCLOPRAMIDE HCL 5 MG/ML IJ SOLN
INTRAMUSCULAR | Status: DC | PRN
  Administered 2017-02-24: 10 mg via INTRAVENOUS

## 2017-02-24 MED ORDER — MAGNESIUM SULFATE BOLUS VIA INFUSION
4.0000 g | Freq: Once | INTRAVENOUS | Status: AC
  Administered 2017-02-24: 4 g via INTRAVENOUS
  Filled 2017-02-24: qty 500

## 2017-02-24 MED ORDER — MORPHINE SULFATE (PF) 0.5 MG/ML IJ SOLN
INTRAMUSCULAR | Status: DC | PRN
  Administered 2017-02-24: .2 mg via INTRATHECAL

## 2017-02-24 MED ORDER — DIPHENHYDRAMINE HCL 50 MG/ML IJ SOLN
INTRAMUSCULAR | Status: DC | PRN
  Administered 2017-02-24: 25 mg via INTRAVENOUS

## 2017-02-24 MED ORDER — IBUPROFEN 600 MG PO TABS: 600.0000 mg | ORAL_TABLET | Freq: Four times a day (QID) | ORAL | Status: DC

## 2017-02-24 MED ORDER — DIPHENHYDRAMINE HCL 25 MG PO CAPS
25.0000 mg | ORAL_CAPSULE | Freq: Four times a day (QID) | ORAL | Status: DC | PRN
  Administered 2017-02-24: 25 mg via ORAL
  Filled 2017-02-24: qty 1

## 2017-02-24 MED ORDER — SCOPOLAMINE 1 MG/3DAYS TD PT72: 1.0000 | MEDICATED_PATCH | Freq: Once | TRANSDERMAL | Status: DC

## 2017-02-24 MED ORDER — FENTANYL CITRATE (PF) 100 MCG/2ML IJ SOLN
INTRAMUSCULAR | Status: DC | PRN
  Administered 2017-02-24: 10 ug via INTRATHECAL

## 2017-02-24 MED ORDER — SCOPOLAMINE 1 MG/3DAYS TD PT72
MEDICATED_PATCH | TRANSDERMAL | Status: AC
  Filled 2017-02-24: qty 1

## 2017-02-24 MED ORDER — MENTHOL 3 MG MT LOZG: 1.0000 | LOZENGE | OROMUCOSAL | Status: DC | PRN

## 2017-02-24 MED ORDER — LACTATED RINGERS IV SOLN
INTRAVENOUS | Status: DC | PRN
Start: 2017-02-24 — End: 2017-02-24
  Administered 2017-02-24 (×3): via INTRAVENOUS

## 2017-02-24 MED ORDER — OXYTOCIN 10 UNIT/ML IJ SOLN
INTRAVENOUS | Status: DC | PRN
  Administered 2017-02-24: 40 [IU] via INTRAVENOUS

## 2017-02-24 MED ORDER — NALBUPHINE HCL 10 MG/ML IJ SOLN: 5.0000 mg | Freq: Once | INTRAMUSCULAR | Status: DC | PRN

## 2017-02-24 MED ORDER — PHENYLEPHRINE 40 MCG/ML (10ML) SYRINGE FOR IV PUSH (FOR BLOOD PRESSURE SUPPORT)
PREFILLED_SYRINGE | INTRAVENOUS | Status: AC
Start: 2017-02-24 — End: ?
  Filled 2017-02-24: qty 40

## 2017-02-24 MED ORDER — KETOROLAC TROMETHAMINE 30 MG/ML IJ SOLN
30.0000 mg | Freq: Four times a day (QID) | INTRAMUSCULAR | Status: AC | PRN
  Administered 2017-02-24: 30 mg via INTRAMUSCULAR

## 2017-02-24 MED ORDER — PRENATAL MULTIVITAMIN CH
1.0000 | ORAL_TABLET | Freq: Every day | ORAL | Status: DC
  Filled 2017-02-24: qty 1

## 2017-02-24 MED ORDER — ONDANSETRON HCL 4 MG/2ML IJ SOLN
INTRAMUSCULAR | Status: AC
  Filled 2017-02-24: qty 2

## 2017-02-24 MED ORDER — SCOPOLAMINE 1 MG/3DAYS TD PT72
MEDICATED_PATCH | TRANSDERMAL | Status: DC | PRN
  Administered 2017-02-24: 1 via TRANSDERMAL

## 2017-02-24 MED ORDER — SODIUM CHLORIDE 0.9% FLUSH: 3.0000 mL | INTRAVENOUS | Status: DC | PRN

## 2017-02-24 MED ORDER — LACTATED RINGERS IV SOLN
INTRAVENOUS | Status: DC
  Administered 2017-02-24: 18:00:00 via INTRAVENOUS

## 2017-02-24 MED ORDER — MAGNESIUM SULFATE 40 G IN LACTATED RINGERS - SIMPLE
2.0000 g/h | INTRAVENOUS | Status: AC
  Administered 2017-02-24 – 2017-02-25 (×2): 2 g/h via INTRAVENOUS
  Filled 2017-02-24 (×2): qty 40

## 2017-02-24 MED ORDER — NALBUPHINE HCL 10 MG/ML IJ SOLN
5.0000 mg | INTRAMUSCULAR | Status: DC | PRN
Start: 2017-02-24 — End: 2017-02-26

## 2017-02-24 MED ORDER — DIBUCAINE 1 % RE OINT: 1.0000 "application " | TOPICAL_OINTMENT | RECTAL | Status: DC | PRN

## 2017-02-24 MED ORDER — PROMETHAZINE HCL 25 MG/ML IJ SOLN: 6.2500 mg | INTRAMUSCULAR | Status: DC | PRN

## 2017-02-24 SURGICAL SUPPLY — 32 items
BARRIER ADHS 3X4 INTERCEED (GAUZE/BANDAGES/DRESSINGS) IMPLANT
CHLORAPREP W/TINT 26ML (MISCELLANEOUS) ×3 IMPLANT
CLAMP CORD UMBIL (MISCELLANEOUS) IMPLANT
CLOTH BEACON ORANGE TIMEOUT ST (SAFETY) ×3 IMPLANT
DECANTER SPIKE VIAL GLASS SM (MISCELLANEOUS) ×3 IMPLANT
DRSG OPSITE POSTOP 4X10 (GAUZE/BANDAGES/DRESSINGS) ×3 IMPLANT
ELECT REM PT RETURN 9FT ADLT (ELECTROSURGICAL) ×3
ELECTRODE REM PT RTRN 9FT ADLT (ELECTROSURGICAL) ×1 IMPLANT
EXTRACTOR VACUUM KIWI (MISCELLANEOUS) IMPLANT
GLOVE BIO SURGEON STRL SZ 6.5 (GLOVE) ×2 IMPLANT
GLOVE BIO SURGEONS STRL SZ 6.5 (GLOVE) ×1
GLOVE BIOGEL PI IND STRL 7.0 (GLOVE) ×2 IMPLANT
GLOVE BIOGEL PI INDICATOR 7.0 (GLOVE) ×4
GOWN STRL REUS W/TWL LRG LVL3 (GOWN DISPOSABLE) ×6 IMPLANT
KIT ABG SYR 3ML LUER SLIP (SYRINGE) ×3 IMPLANT
NEEDLE HYPO 22GX1.5 SAFETY (NEEDLE) ×3 IMPLANT
NEEDLE HYPO 25X5/8 SAFETYGLIDE (NEEDLE) ×3 IMPLANT
NS IRRIG 1000ML POUR BTL (IV SOLUTION) ×3 IMPLANT
PACK C SECTION WH (CUSTOM PROCEDURE TRAY) ×3 IMPLANT
PAD ABD 7.5X8 STRL (GAUZE/BANDAGES/DRESSINGS) ×3 IMPLANT
PAD OB MATERNITY 4.3X12.25 (PERSONAL CARE ITEMS) ×3 IMPLANT
PENCIL SMOKE EVAC W/HOLSTER (ELECTROSURGICAL) ×3 IMPLANT
RETRACTOR WND ALEXIS 25 LRG (MISCELLANEOUS) IMPLANT
RTRCTR WOUND ALEXIS 25CM LRG (MISCELLANEOUS)
SPONGE DRAIN TRACH 4X4 STRL 2S (GAUZE/BANDAGES/DRESSINGS) ×6 IMPLANT
SUT VIC AB 0 CT1 36 (SUTURE) ×18 IMPLANT
SUT VIC AB 2-0 CT1 27 (SUTURE) ×2
SUT VIC AB 2-0 CT1 TAPERPNT 27 (SUTURE) ×1 IMPLANT
SUT VIC AB 4-0 PS2 27 (SUTURE) ×3 IMPLANT
SYR CONTROL 10ML LL (SYRINGE) ×3 IMPLANT
TOWEL OR 17X24 6PK STRL BLUE (TOWEL DISPOSABLE) ×3 IMPLANT
TRAY FOLEY BAG SILVER LF 14FR (SET/KITS/TRAYS/PACK) IMPLANT

## 2017-02-24 NOTE — Op Note (Signed)
[name] PROCEDURE DATE: 02/24/2017  PREOPERATIVE DIAGNOSES: Intrauterine pregnancy at [redacted]w[redacted]d weeks gestation; abruptio placenta  POSTOPERATIVE DIAGNOSES: The same  PROCEDURE: Repeat Low Transverse Cesarean Section  SURGEON:  Dr. Scheryl Darter  ASSISTANT:  n/a  ANESTHESIOLOGY TEAM: Anesthesiologist: Jairo Ben, MD CRNA: Armanda Heritage, CRNA  INDICATIONS: Brandy Wang is a 26 y.o. [phone] at [redacted]w[redacted]d here for cesarean section secondary to the indications listed under preoperative diagnoses; please see preoperative note for further details.  The risks of cesarean section were discussed with the patient including but were not limited to: bleeding which may require transfusion or reoperation; infection which may require antibiotics; injury to bowel, bladder, ureters or other surrounding organs; injury to the fetus; need for additional procedures including hysterectomy in the event of a life-threatening hemorrhage; placental abnormalities wth subsequent pregnancies, incisional problems, thromboembolic phenomenon and other postoperative/anesthesia complications.   The patient concurred with the proposed plan, giving informed written consent for the procedure.    FINDINGS:  Viable female infant in cephalic presentation.  Apgars 8 and 9.  Clear amniotic fluid.  Intact placenta, three vessel cord.  Normal uterus, fallopian tubes and ovaries bilaterally.  ANESTHESIA: Spinal  INTRAVENOUS FLUIDS: 2200 ml   ESTIMATED BLOOD LOSS: 700 ml URINE OUTPUT:  100 ml SPECIMENS: Placenta sent to pathology COMPLICATIONS: None immediate  PROCEDURE IN DETAIL:  The patient preoperatively received intravenous antibiotics and had sequential compression devices applied to her lower extremities.  She was then taken to the operating room where spinal anesthesia was administered and was found to be adequate. She was then placed in a dorsal supine position with a leftward tilt, and prepped and draped in a  sterile manner.  A foley catheter was placed into her bladder and attached to constant gravity.  After an adequate timeout was performed, a Pfannenstiel skin incision was made with scalpel over her preexisting scar and carried through to the underlying layer of fascia. The fascia was incised in the midline, and this incision was extended bilaterally using the Mayo scissors.  Kocher clamps were applied to the superior aspect of the fascial incision and the underlying rectus muscles were dissected off bluntly.  A similar process was carried out on the inferior aspect of the fascial incision. The rectus muscles were separated in the midline bluntly and the peritoneum was entered bluntly. Attention was turned to the lower uterine segment where a low transverse hysterotomy was made with a scalpel and extended bilaterally bluntly.  The infant was successfully delivered, the cord was clamped and cut after one minute and the infant was handed over to the awaiting neonatology team. Uterine massage was then administered, and the placenta delivered intact with a three-vessel cord. The uterus was then cleared of clots and debris.  The hysterotomy was closed with 0 Vicryl in a running locked fashion, and an imbricating layer was also placed with 0 Vicryl.   The pelvis was cleared of all clot and debris. Hemostasis was confirmed on all surfaces.  The peritoneum was closed with a 2-0 Vicryl running stitch. The fascia was then closed using 0 Vicryl in a running fashion.  The subcutaneous layer was irrigated,  and 30 ml of 0.5% Marcaine was injected subcutaneously around the incision.  The skin was closed with a 4-0 Vicryl subcuticular stitch. The patient tolerated the procedure well. Sponge, lap, instrument and needle counts were correct x 3.  She was taken to the recovery room in stable condition.    Adam Phenix, MD, FACOG Attending  Obstetrician & MicrobiologistGynecologist Faculty Practice, [facility]

## 2017-02-24 NOTE — Transfer of Care (Signed)
Immediate Anesthesia Transfer of Care Note  Patient: Brandy SettersJaneika Wang  Procedure(s) Performed: Procedure(s): REPEAT CESAREAN SECTION (N/A)  Patient Location: PACU  Anesthesia Type:Spinal  Level of Consciousness: awake, alert  and oriented  Airway & Oxygen Therapy: Patient Spontanous Breathing  Post-op Assessment: Report given to RN and Post -op Vital signs reviewed and stable  Post vital signs: Reviewed and stable  Last Vitals:  Vitals:   02/23/17 2341 02/24/17 0758  BP: (!) 147/93 (!) 152/98  Pulse: 82 72  Resp: 18 18  Temp: 36.9 C 36.5 C    Last Pain:  Vitals:   02/24/17 0845  TempSrc:   PainSc: 10-Worst pain ever      Patients Stated Pain Goal: 3 (02/23/17 2350)  Complications: No apparent anesthesia complications

## 2017-02-24 NOTE — Progress Notes (Signed)
OB Note CTSP re: VB and contractions  Feeling them q6629m and no s/s of pre-x. Also started having more wine colored VB and no brown d/c like before.   Patient Vitals for the past 24 hrs:  BP Temp Temp src Pulse Resp SpO2  02/24/17 0758 (!) 152/98 97.7 F (36.5 C) - 72 18 -  02/23/17 2341 (!) 147/93 98.5 F (36.9 C) - 82 18 98 %  02/23/17 2143 139/79 99.2 F (37.3 C) Oral 86 18 97 %  02/23/17 1611 137/86 98.4 F (36.9 C) Oral 75 17 -  02/23/17 1600 137/86 98.9 F (37.2 C) Oral 87 18 98 %  02/23/17 1227 (!) 141/80 - - 83 - -   1/50/high Wine colored VB  catgory I  With UCs q3-7340m  A/p: pt likely abrupting more D/w OR and will move her up to now instead of later today like scheduled CBC pending from this AM Handoff given to dr Debroah Looparnold.   Cornelia Copaharlie Thresa Dozier, Jr MD Attending [facility] (Faculty Practice) 02/24/2017 Time: [phone]

## 2017-02-24 NOTE — Anesthesia Procedure Notes (Signed)
Spinal  Patient location during procedure: OR End time: 02/24/2017 9:49 AM Staffing Anesthesiologist: Jairo BenJACKSON, Eason Housman Performed: anesthesiologist  Preanesthetic Checklist Completed: patient identified, surgical consent, pre-op evaluation, timeout performed, IV checked, risks and benefits discussed and monitors and equipment checked Spinal Block Patient position: sitting Prep: ChloraPrep and site prepped and draped Patient monitoring: blood pressure, continuous pulse ox, cardiac monitor and heart rate Approach: midline Location: L3-4 Injection technique: single-shot Needle Needle type: Pencan  Needle gauge: 24 G Needle length: 9 cm Assessment Sensory level: T6 Additional Notes Pt identified in Operating room.  Monitors applied. Working IV access confirmed. Sterile prep, drape lumbar spine.  1% lido local L 3,4.  #24ga Pencaninto clear CSF L 3,4.  12mg  0.75% Bupivacaine with dextrose, fentanyl, morphine injected with asp CSF beginning and end of injection.  Patient asymptomatic, VSS, no heme aspirated, tolerated well.  Sandford Craze Krishauna Schatzman, MD

## 2017-02-24 NOTE — Anesthesia Postprocedure Evaluation (Signed)
Anesthesia Post Note  Patient: Fransico SettersJaneika Hassey  Procedure(s) Performed: Procedure(s) (LRB): REPEAT CESAREAN SECTION (N/A)     Patient location during evaluation: PACU Anesthesia Type: Spinal Level of consciousness: awake and alert, oriented and patient cooperative Pain management: pain level controlled Vital Signs Assessment: post-procedure vital signs reviewed and stable Respiratory status: spontaneous breathing, nonlabored ventilation and respiratory function stable Cardiovascular status: blood pressure returned to baseline and stable (required labetolol and Magnesium) Postop Assessment: spinal receding, patient able to bend at knees and no signs of nausea or vomiting Anesthetic complications: no    Last Vitals:  Vitals:   02/24/17 1320 02/24/17 1333  BP:  (!) 151/100  Pulse: 86 72  Resp: (!) 21 18  Temp:  37.1 C    Last Pain:  Vitals:   02/24/17 1315  TempSrc: Oral  PainSc:    Pain Goal: Patients Stated Pain Goal: 3 (02/23/17 2350)               Erling CruzJACKSON,E. Gabriella Woodhead

## 2017-02-24 NOTE — Anesthesia Preprocedure Evaluation (Signed)
Anesthesia Evaluation  Patient identified by MRN, date of birth, ID band Patient awake    Reviewed: Allergy & Precautions, NPO status , Patient's Chart, lab work & pertinent test resultsPreop documentation limited or incomplete due to emergent nature of procedure.  History of Anesthesia Complications Negative for: history of anesthetic complications  Airway Mallampati: I  TM Distance: >3 FB Neck ROM: Full    Dental  (+) Dental Advisory Given   Pulmonary neg pulmonary ROS,    breath sounds clear to auscultation       Cardiovascular hypertension (PIH, no meds),  Rhythm:Regular Rate:Normal     Neuro/Psych negative neurological ROS     GI/Hepatic Neg liver ROS, GERD  ,  Endo/Other  negative endocrine ROS  Renal/GU negative Renal ROS     Musculoskeletal   Abdominal   Peds  Hematology Hb 10.1, plt 230k   Anesthesia Other Findings   Reproductive/Obstetrics (+) Pregnancy Pt with abruption, has been transfused                             Anesthesia Physical Anesthesia Plan  ASA: III and emergent  Anesthesia Plan: Spinal   Post-op Pain Management:    Induction:   PONV Risk Score and Plan: 2 and Ondansetron and Dexamethasone  Airway Management Planned: Natural Airway  Additional Equipment:   Intra-op Plan:   Post-operative Plan:   Informed Consent: I have reviewed the patients History and Physical, chart, labs and discussed the procedure including the risks, benefits and alternatives for the proposed anesthesia with the patient or authorized representative who has indicated his/her understanding and acceptance.   Dental advisory given  Plan Discussed with: CRNA and Surgeon  Anesthesia Plan Comments: (Plan routine monitors, SAB)        Anesthesia Quick Evaluation

## 2017-02-25 LAB — CBC
HCT: 19.7 % — ABNORMAL LOW (ref 36.0–46.0)
HEMATOCRIT: 18.1 % — AB (ref 36.0–46.0)
HEMOGLOBIN: 6.4 g/dL — AB (ref 12.0–15.0)
HEMOGLOBIN: 6 g/dL — AB (ref 12.0–15.0)
MCH: 26.9 pg (ref 26.0–34.0)
MCH: 27.4 pg (ref 26.0–34.0)
MCHC: 32.5 g/dL (ref 30.0–36.0)
MCHC: 33.1 g/dL (ref 30.0–36.0)
MCV: 82.8 fL (ref 78.0–100.0)
MCV: 82.6 fL (ref 78.0–100.0)
PLATELETS: 236 10*3/uL (ref 150–400)
Platelets: 189 10*3/uL (ref 150–400)
RBC: 2.38 MIL/uL — AB (ref 3.87–5.11)
RBC: 2.19 MIL/uL — ABNORMAL LOW (ref 3.87–5.11)
RDW: 16.3 % — ABNORMAL HIGH (ref 11.5–15.5)
RDW: 17.4 % — AB (ref 11.5–15.5)
WBC: 15.4 10*3/uL — AB (ref 4.0–10.5)
WBC: 12.6 10*3/uL — ABNORMAL HIGH (ref 4.0–10.5)

## 2017-02-25 LAB — PREPARE RBC (CROSSMATCH)

## 2017-02-25 MED ORDER — KETOROLAC TROMETHAMINE 30 MG/ML IJ SOLN
30.0000 mg | Freq: Four times a day (QID) | INTRAMUSCULAR | Status: DC | PRN
  Administered 2017-02-25 – 2017-02-26 (×4): 30 mg via INTRAVENOUS
  Filled 2017-02-25 (×3): qty 1

## 2017-02-25 MED ORDER — SODIUM CHLORIDE 0.9 % IV SOLN
510.0000 mg | Freq: Once | INTRAVENOUS | Status: AC
  Administered 2017-02-25: 510 mg via INTRAVENOUS
  Filled 2017-02-25: qty 17

## 2017-02-25 MED ORDER — DIPHENHYDRAMINE HCL 25 MG PO CAPS
25.0000 mg | ORAL_CAPSULE | Freq: Once | ORAL | Status: AC
  Administered 2017-02-25: 25 mg via ORAL
  Filled 2017-02-25: qty 1

## 2017-02-25 MED ORDER — RHO D IMMUNE GLOBULIN 1500 UNIT/2ML IJ SOSY
300.0000 ug | PREFILLED_SYRINGE | Freq: Once | INTRAMUSCULAR | Status: AC
  Administered 2017-02-25: 300 ug via INTRAVENOUS
  Filled 2017-02-25: qty 2

## 2017-02-25 MED ORDER — SODIUM CHLORIDE 0.9 % IV SOLN
Freq: Once | INTRAVENOUS | Status: AC
  Administered 2017-02-25: 10 mL via INTRAVENOUS

## 2017-02-25 MED ORDER — ACETAMINOPHEN 325 MG PO TABS
650.0000 mg | ORAL_TABLET | Freq: Once | ORAL | Status: AC
  Administered 2017-02-25: 650 mg via ORAL
  Filled 2017-02-25: qty 2

## 2017-02-25 NOTE — Progress Notes (Signed)
Patient ID: Brandy Wang, female   DOB: June 05, 1991, 26 y.o.   MRN: [id number] In to see pt d/t RN request.  Patient states she is "very weak". HgB down to 6.0 now.  Patient states she received some blood before the delivery and feels that would be good now.  Orders placed to transfuse 2 units PRBCs  Raelyn MoraRolitta Cigi Bega, CNM 02/25/2017 12:57 PM

## 2017-02-25 NOTE — Addendum Note (Signed)
Addendum  created 02/25/17 0841 by Angela AdamWrinkle, Amabel Stmarie G, CRNA   Sign clinical note

## 2017-02-25 NOTE — Clinical Social Work Maternal (Signed)
CLINICAL SOCIAL WORK MATERNAL/CHILD NOTE  Patient Details  Name: Brandy Wang MRN: [id number] Date of Birth: [birth year]  Date:  02/25/2017  Clinical Social Worker Initiating Note:  Laurey Arrow Date/ Time Initiated:  02/25/17/1316     Child's Name:  Frederico Hamman   Legal Guardian:   (FOB is Drema Halon 12/09/92 ([phone]))   Need for Interpreter:  None   Date of Referral:  02/25/17     Reason for Referral:  Late or No Prenatal Care    Referral Source:  Central Nursery   Address:  [place]. [place] [address]  Phone number:  [phone]   Household Members:  Self, Significant Other   Natural Supports (not living in the home):  Immediate Family, Extended Family (FOB's family will also be a source of support.)   Professional Supports: None   Employment: Ship broker   Type of Work:     Education:  Database administrator Resources:  [employer]   Other Resources:  [employer], Physicist, medical    Cultural/Religious Considerations Which May Impact Care:  None Reported  Strengths:  Ability to meet basic needs , Engineer, materials , Home prepared for child    Risk Factors/Current Problems:  Substance Use    Cognitive State:  Alert , Able to Concentrate , Linear Thinking    Mood/Affect:  Tearful , Flat , Sad , Interested , Comfortable    CSW Assessment: CSW met with MOB for a consult for [facility].  When CSW arrived, MOB was crying in bed while holding infant.  FOB was demonstrating support for MOB by CSW observing FOB massaging MOB's hand and wiping MOB's tears.  With MOB's permission, CSW asked FOB to leave the room in effort for CSW to meet with MOB in private.  CSW explained CSW's role and inquired about MOB's emotions.  MOB reported feelings of being overwhelmed and scared due to infant not passing the hearing screen.  MOB communicated that MOB oldest son was born prematurely in was in the NICU.  MOB communicated that hearing the information about the  hearing screen reminded MOB of all the fear and traumatic experience with oldest child being in the NICU. CSW validated and normalized MOB's thoughts and feelings.  MOB depressive mood decrease as CSW completed the assessment.  CSW educated MOB about PPD. CSW informed MOB of possible supports and interventions to decrease PPD.  CSW also encouraged MOB to seek medical attention if needed for increased signs and symptoms for PPD.  MOB denied SI, HI, DV and PPD hx.  However, MOB was receptive to PPD literature and PPD checklist.  CSW inquired about MOB's [facility] and MOB reported that MOB initiated [facility] in December 2018, at the [facility], in [place].  CSW explained to MOB that MOB's records have not been provided and has been requested by the hospital.  MOB appeared puzzled and informed CSW that MOB was going to contact the [facility].  CSW explained to MOB the hospital's policy regarding NPNC, and the 2 drug screens for infant.  MOB was understanding.  CSW explained to MOB that the infant's UDS was negative and CSW will continue to monitor the infant's CDS.  CSW made MOB aware that if the screen is positive without an explanation, CSW will make a report to [facility] CPS.  MOB would not confirm or deny a hx of CPS involvement.  When CSW asked about the care of MOB's oldest 2 children Audelia Acton Izard-Rahming 03/21/10 and Blima Rich  Weatherall 08/30/11) MOB reported that they are currently residing with MOB's cousin in [place], [place].  MOB refused to provided MOB with MOB's cousin name or contact information. MOB denied the use of marijuana during pregnancy and was informed of MOB's positive screens on 02/11/17 and 02/19/17.  CSW offered MOB resources for SA and MOB declined. MOB also denied any barrier for follow-up appointments for MOB and infant.   CSW spoke with a representatives from [place] and [place] CPS and all three representatives informed CSW that MOB did not have an open case with CPS.   However, they acknowledged a CPS hx. CSW will continue to monitor infant's CDS and will make a CPS report if warranted. There are no barriers to d/c.   CSW Plan/Description:  Information/Referral to Community Resources , Patient/Family Education , No Further Intervention Required/No Barriers to Discharge   Angel Boyd-Gilyard, MSW, LCSW Clinical Social Work [phone]  ANGEL D BOYD-GILYARD, LCSW 02/25/2017, 1:25 PM  

## 2017-02-25 NOTE — Progress Notes (Signed)
Subjective:no headache or dizziness Postpartum Day 1: Cesarean Delivery Patient reports incisional pain and tolerating PO.    Objective: Vital signs in last 24 hours: Temp:  [97.4 F (36.3 C)-98.7 F (37.1 C)] 98.6 F (37 C) (06/15 0050) Pulse Rate:  [63-100] 78 (06/15 0500) Resp:  [16-32] 18 (06/15 0700) BP: (120-164)/(68-118) 129/90 (06/15 0500) SpO2:  [91 %-100 %] 100 % (06/15 0500)  Physical Exam:  General: alert, cooperative and no distress Lochia: appropriate Uterine Fundus: firm Incision: no significant drainage DVT Evaluation: No evidence of DVT seen on physical exam.   Recent Labs  02/24/17 1444 02/25/17 0125  HGB 9.8* 6.4*  HCT 29.6* 19.7*    Intake/Output Summary (Last 24 hours) at 02/25/17 0801 Last data filed at 02/25/17 0700  Gross per 24 hour  Intake             3400 ml  Output             2115 ml  Net             1285 ml     Assessment/Plan: Status post Cesarean section. Postoperative course complicated by anemia  Repeat CBC today and evaluate her tolerance to anemia. Feraheme IV ordered. D/C mg so4 after 24 hr.  Scheryl DarterJames Arnold 02/25/2017, 8:01 AM

## 2017-02-25 NOTE — Progress Notes (Signed)
CSW acknowledges consult.  CSW attempted to meet with MOB, however MOB and infant was being attended by bedside nurse. CSW will attempt to visit with MOB at a later time.   Tripton Ned Boyd-Gilyard, MSW, LCSW Clinical Social Work [phone]  

## 2017-02-25 NOTE — Anesthesia Postprocedure Evaluation (Signed)
Anesthesia Post Note  Patient: Brandy SettersJaneika Wang  Procedure(s) Performed: Procedure(s) (LRB): REPEAT CESAREAN SECTION (N/A)     Patient location during evaluation: Mother Baby Anesthesia Type: Spinal Level of consciousness: awake and alert, oriented and patient cooperative Pain management: pain level controlled Vital Signs Assessment: post-procedure vital signs reviewed and stable Respiratory status: spontaneous breathing Cardiovascular status: stable Postop Assessment: no headache, patient able to bend at knees, no signs of nausea or vomiting and spinal receding Anesthetic complications: no Comments: Pain score 2.    Last Vitals:  Vitals:   02/25/17 0500 02/25/17 0700  BP: 129/90   Pulse: 78   Resp: 18 18  Temp:      Last Pain:  Vitals:   02/24/17 2344  TempSrc:   PainSc: 7    Pain Goal: Patients Stated Pain Goal: 3 (02/24/17 2344)               Merrilyn PumaWRINKLE,[name]

## 2017-02-25 NOTE — Progress Notes (Signed)
Dr. Debroah LoopArnold notified of critical lab Hemoglobin 6.4, acknowledged, no further orders at this time.

## 2017-02-26 LAB — RH IG WORKUP (INCLUDES ABO/RH)
ABO/RH(D): O NEG
FETAL SCREEN: NEGATIVE
GESTATIONAL AGE(WKS): 37
Unit division: 0

## 2017-02-26 LAB — CBC
HCT: 22.3 % — ABNORMAL LOW (ref 36.0–46.0)
Hemoglobin: 7.7 g/dL — ABNORMAL LOW (ref 12.0–15.0)
MCH: 28.4 pg (ref 26.0–34.0)
MCHC: 34.5 g/dL (ref 30.0–36.0)
MCV: 82.3 fL (ref 78.0–100.0)
PLATELETS: 186 10*3/uL (ref 150–400)
RBC: 2.71 MIL/uL — ABNORMAL LOW (ref 3.87–5.11)
RDW: 16.6 % — AB (ref 11.5–15.5)
WBC: 11.6 10*3/uL — AB (ref 4.0–10.5)

## 2017-02-26 LAB — BPAM RBC
BLOOD PRODUCT EXPIRATION DATE: 201806282359
Blood Product Expiration Date: 201806282359
ISSUE DATE / TIME: 201806151516
ISSUE DATE / TIME: 201806151809
UNIT TYPE AND RH: 9500
UNIT TYPE AND RH: 9500

## 2017-02-26 LAB — TYPE AND SCREEN
ABO/RH(D): O NEG
Antibody Screen: POSITIVE
DAT, IgG: NEGATIVE
UNIT DIVISION: 0
UNIT DIVISION: 0

## 2017-02-26 MED ORDER — AMLODIPINE BESYLATE 10 MG PO TABS: 10.0000 mg | ORAL_TABLET | Freq: Every day | ORAL | 0 refills | Status: AC

## 2017-02-26 MED ORDER — AMLODIPINE BESYLATE 5 MG PO TABS
5.0000 mg | ORAL_TABLET | Freq: Once | ORAL | Status: AC
  Administered 2017-02-26: 5 mg via ORAL
  Filled 2017-02-26: qty 1

## 2017-02-26 MED ORDER — AMLODIPINE BESYLATE 10 MG PO TABS: 10.0000 mg | ORAL_TABLET | Freq: Every day | ORAL | Status: DC

## 2017-02-26 MED ORDER — AMLODIPINE BESYLATE 5 MG PO TABS
5.0000 mg | ORAL_TABLET | Freq: Every day | ORAL | Status: DC
  Administered 2017-02-26: 5 mg via ORAL
  Filled 2017-02-26: qty 1

## 2017-02-26 NOTE — Progress Notes (Signed)
Mrs Sandstrom request to be discharged despite BPs trending up, Pt says she can monitor them at home and will attend follow up appointment. Pt insisting that she is going to leave, Dr. Ilda Basset notified and met with patient. Pt has been educated on possible complications including the risk of seizures and stroke, Discussed the risk of infection with Pt and incision care. IV Dc'd Pt leaving AMA via wheelchair after signing AMA form with possession accounted for.

## 2017-02-26 NOTE — Progress Notes (Signed)
Subjective: Postpartum Day 2: Cesarean Delivery Patient reports feeling well. Incisional pain well controlled. She ambulated around the hall and denies chest pain, SOB, lightheadedness/dizziness.    Objective: Vital signs in last 24 hours: Temp:  [98.2 F (36.8 C)-99.7 F (37.6 C)] 99.3 F (37.4 C) (06/15 2055) Pulse Rate:  [75-96] 75 (06/16 0400) Resp:  [17-20] 20 (06/15 2055) BP: (132-161)/(78-99) 153/99 (06/16 0400) SpO2:  [96 %-100 %] 100 % (06/15 2055)  Physical Exam:  General: alert, cooperative and no distress Lochia: appropriate Uterine Fundus: firm Incision: dressing stained with dark blood, dry and intact DVT Evaluation: No evidence of DVT seen on physical exam. Negative Homan's sign.   Recent Labs  02/25/17 1124 02/26/17 0018  HGB 6.0* 7.7*  HCT 18.1* 22.3*    Assessment/Plan: Status post Cesarean section. Doing well postoperatively.  Elevated BP, patient started on Norvasc Continue close monitoring Patient interested in discharge home later to day if possible.  Brandy Wang 02/26/2017, 6:42 AM

## 2017-02-26 NOTE — Discharge Summary (Signed)
Obstetrical Discharge Summary  Date of Admission: 02/19/2017 Date of Discharge: 02/26/2017  Primary OB: [facility] [place], [place]  Gestational Age at Delivery: 262w0d   Antepartum complications: poor patient compliance, chronic abruption, Rh negative, history of severe pre-eclampsia, h/o c-section, anemia  Reason for Admission: chronic abruption, vaginal bleeding Date of Delivery: 02/24/2017  Delivered By: Scheryl DarterJames Arnold, MD Delivery Type: urgent repeat LTCS Intrapartum complications/course: Abruptio Placenta. MFM recommended delivery at 37wks, if patient stayed stable. Patient had increased bleeding a few hours before her scheduled repeat c-section and was taken urgently earlier.  Anesthesia: spinal Placenta: Delivered and expressed via active management. Intact: yes. To pathology: yes-->showed abruption at pathology Laceration: n/a Episiotomy: n/a EBL: 700mL Baby: Liveborn female, APGARs 8/9, weight 2705 g.    Discharge Diagnosis: Delivered. Left AMA.   Postpartum course:  *Severe pre-eclampsia: patient had mild pre-eclampsia before delivered and then developed severe pre-eclampsia immediately postpartum/post op based on BPs. She had 24h of Mg. She was started on norvasc 5mg  po qday on POD#2 b/c of BPs that were trending up and staying in the ULN for mild range BPs; this was increased to 10mg  po qday on that same day. *Rh negative: pt received rhogam on admission and then also received post op *Anemia: patient transfused 2 units prior to delivery, due to chronic anemia. She was then transfused two units post operatively *Dispo: patient left AMA  Discharge Vital Signs:  Patient Vitals for the past 6 hrs:  BP Temp Temp src Pulse Resp SpO2  02/26/17 1545 (!) 154/100 - - - - -  02/26/17 1351 (!) 154/102 98.3 F (36.8 C) Oral 86 18 98 %  02/26/17 1234 (!) 157/94 99.8 F (37.7 C) Oral 89 - -    Discharge Exam:  NAD   Recent Labs Lab 02/25/17 0125 02/25/17 1124  02/26/17 0018  WBC 15.4* 12.6* 11.6*  HGB 6.4* 6.0* 7.7*  HCT 19.7* 18.1* 22.3*  PLT 236 189 186   CMP Latest Ref Rng & Units 02/24/2017 02/21/2017 02/21/2017  Glucose 65 - 99 mg/dL 91 98 16(X64(L)  BUN 6 - 20 mg/dL 5(L) 6 <0(R<5(L)  Creatinine 0.44 - 1.00 mg/dL 6.040.49 5.400.47 9.810.45  Sodium 135 - 145 mmol/L 135 137 136  Potassium 3.5 - 5.1 mmol/L 3.3(L) 3.1(L) 3.1(L)  Chloride 101 - 111 mmol/L 108 107 106  CO2 22 - 32 mmol/L 23 23 24   Calcium 8.9 - 10.3 mg/dL 8.0(L) 8.1(L) 8.2(L)  Total Protein 6.5 - 8.1 g/dL 1.9(J5.8(L) 6.3(L) 5.7(L)  Total Bilirubin 0.3 - 1.2 mg/dL 0.3 0.8 0.6  Alkaline Phos 38 - 126 U/L 130(H) 127(H) 116  AST 15 - 41 U/L 18 16 14(L)  ALT 14 - 54 U/L 9(L) 9(L) 7(L)    Disposition: left AMA  Rh Immune globulin given: yes  Contraception: no method; patient declined BTL  Plan:  Brandy Wang left AMA Follow-up appointment request with the women's outpatient clinic in 1 week for a BP visit sent to clinic schedulers  No future appointments.  Discharge Medications: Norvasc 10mg  PO qday She was not given a prescription for narcotics because she left AMA  Cornelia Copaharlie Emelio Schneller, Jr. MD Attending Center for [facility](Faculty Practice)

## 2017-02-26 NOTE — Progress Notes (Addendum)
OB Note  Patient desires to leave now. D/w pt that she is still close to being just post op (POD#2) and that she hasn't had an uncomplicated post op course (abruption, urgent c-section, diagnosed with severe pre-eclampsia, anemia and needing another transfusion) and she was started on norvasc 5mg  PO qday this morning b/c her BPs were ULN for mild range BPs after coming of post op Mg and I increased to 10mg  just recently.  She denies any HA, visual changes, abdominal pain (except incisional that is helped with PO meds); she has passed flatus and voided.   I d/w her the risk of leaving before more prolonged observation of her BPs, including risk of seizure, stroke, death but she'd like to still leave. I told her that if she does leave it would be against AMA.  She states that this all happened to her last time and everything was fine and that she can better monitor her BP and relax more at home. Pt to leave AMA; Rx for norvasc 10mg  po qday given.   In basket message sent to clinic for follow up sometime this upcoming week, which pt states she would go to   Cornelia Copaharlie Shawndra Clute, Jr MD Attending Center for [facility] (Faculty Practice) 02/26/2017 Time: [phone]

## 2017-03-01 ENCOUNTER — Ambulatory Visit: Payer: Medicaid Other

## 2017-04-14 ENCOUNTER — Telehealth: Payer: Self-pay | Admitting: *Deleted

## 2017-04-14 NOTE — Telephone Encounter (Signed)
Refill request fax for Norvasc received from [facility] pharmacy. Pt had emergent C/S on 6/14, left AMA on 6/16 and DNKA on 6/19 for BP check. Message was sent to Dr. Vergie LivingPickens and he stated pt needs to be seen prior to approval of refill. I called pt and she stated that [facility] Her BP was normal @ the time and she was not having sx of pre-eclampsia. She was told by the provider that she could stop taking the Norvasc. Pt stated that she is not having H/A or visual disturbances. I advised pt to go to the hospital if these sx develop and she voiced understanding.
